# Patient Record
Sex: Female | Born: 1980 | Race: White | Hispanic: No | Marital: Married | Smoking: Never smoker
Health system: Southern US, Community
[De-identification: ages and names within clinical notes are randomized; demographics above are authoritative.]

## PROBLEM LIST (undated history)

## (undated) DIAGNOSIS — M1990 Unspecified osteoarthritis, unspecified site: Secondary | ICD-10-CM

## (undated) DIAGNOSIS — E039 Hypothyroidism, unspecified: Secondary | ICD-10-CM

## (undated) DIAGNOSIS — C4491 Basal cell carcinoma of skin, unspecified: Secondary | ICD-10-CM

## (undated) DIAGNOSIS — Z8619 Personal history of other infectious and parasitic diseases: Secondary | ICD-10-CM

## (undated) HISTORY — DX: Basal cell carcinoma of skin, unspecified: C44.91

## (undated) HISTORY — DX: Personal history of other infectious and parasitic diseases: Z86.19

## (undated) HISTORY — DX: Hypothyroidism, unspecified: E03.9

## (undated) HISTORY — DX: Unspecified osteoarthritis, unspecified site: M19.90

---

## 2008-07-30 HISTORY — PX: OTHER SURGICAL HISTORY: SHX169

## 2009-01-27 HISTORY — PX: OTHER SURGICAL HISTORY: SHX169

## 2011-07-09 DIAGNOSIS — Z79899 Other long term (current) drug therapy: Secondary | ICD-10-CM | POA: Insufficient documentation

## 2013-01-05 ENCOUNTER — Other Ambulatory Visit (HOSPITAL_COMMUNITY)
Admission: RE | Admit: 2013-01-05 | Discharge: 2013-01-05 | Disposition: A | Discharge status: Home / Self Care | Payer: BC Managed Care – PPO | Source: Ambulatory Visit | Attending: Family | Admitting: Family

## 2013-01-05 ENCOUNTER — Encounter: Payer: Self-pay | Admitting: Family

## 2013-01-05 ENCOUNTER — Ambulatory Visit (INDEPENDENT_AMBULATORY_CARE_PROVIDER_SITE_OTHER): Payer: BC Managed Care – PPO | Admitting: Family

## 2013-01-05 VITALS — BP 90/60 | HR 86 | Temp 98.8°F | Resp 16 | Ht 66.5 in | Wt 123.1 lb

## 2013-01-05 DIAGNOSIS — Z0000 Encounter for general adult medical examination without abnormal findings: Secondary | ICD-10-CM

## 2013-01-05 DIAGNOSIS — Z01419 Encounter for gynecological examination (general) (routine) without abnormal findings: Secondary | ICD-10-CM | POA: Insufficient documentation

## 2013-01-05 DIAGNOSIS — M069 Rheumatoid arthritis, unspecified: Secondary | ICD-10-CM

## 2013-01-05 DIAGNOSIS — M064 Inflammatory polyarthropathy: Secondary | ICD-10-CM | POA: Insufficient documentation

## 2013-01-05 DIAGNOSIS — Z23 Encounter for immunization: Secondary | ICD-10-CM

## 2013-01-05 NOTE — Assessment & Plan Note (Signed)
Continue healthy diet, exercise.  Pap performed today.  She will return for fasting labs.  Tdap today.

## 2013-01-05 NOTE — Progress Notes (Signed)
Subjective:    Patient ID: Gina Butler, female    DOB: April 25, 1981, 32 y.o.   MRN: [id number]  HPI  [name] is a 32 yr old female who presents today to establish care.  1) Rheumatoid Arthritis- Reports that in 2009 had severe swelling in her right ankle.  She is followed at [facility].  She has been off of methotrexate x 1 year.  She sees Renee Pain NP.    2) Preventative care-  Immunizations: ? Last tetanus prior to college. Diet: reports reasonably healthy diet.   Exercise: does elliptical 30 min 6 times a week.  Pap Smear: 2011- normal- never had abnormal.  Review of Systems  Constitutional: Negative for unexpected weight change.  HENT: Negative for hearing loss and congestion.   Eyes: Negative for visual disturbance.  Respiratory: Negative for cough.   Cardiovascular:       Occasional swelling in the mornings in the feet/ankles  Gastrointestinal: Negative for nausea, constipation and blood in stool.  Genitourinary: Negative for dysuria, frequency and menstrual problem.  Musculoskeletal: Positive for joint swelling.  Skin: Negative for rash.  Neurological: Negative for headaches.  Hematological: Negative for adenopathy.  Psychiatric/Behavioral:       Denies depression/anxiety   Past Medical History  Diagnosis Date  . Arthritis   . History of chicken pox     History   Social History  . Marital Status: Married    Spouse Name: N/A    Number of Children: 0  . Years of Education: N/A   Occupational History  .     Social History Main Topics  . Smoking status: Never Smoker   . Smokeless tobacco: Never Used  . Alcohol Use: 1 - 2 oz/week    2-4 drink(s) per week  . Drug Use: Not on file  . Sexually Active: Not on file   Other Topics Concern  . 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 Other surgical history Right  2010    ankle-- deep tissue biopsy    Family History  Problem Relation Age of Onset  . Arthritis Mother     osteoarthritis  . Cancer Maternal Aunt     breast  . Arthritis Maternal Grandmother   . Stroke Maternal Grandfather   . Schizophrenia Maternal Aunt     Allergies  Allergen Reactions  . Compazine (Prochlorperazine Edisylate) Other (See Comments)    "Affected the muscles in my neck, could not move my head."  . Sulfa Antibiotics     Childhood reaction    No current outpatient prescriptions on file prior to visit.   No current facility-administered medications on file prior to visit.    BP 90/60  Pulse 86  Temp(Src) 98.8 F (37.1 C) (Oral)  Resp 16  Ht 5' 6.5" (1.689 m)  Wt 123 lb 1.9 oz (55.847 kg)  BMI 19.58 kg/m2  SpO2 96%  LMP 01/01/2013        Objective:   Physical Exam  Physical Exam  Constitutional: She is oriented to person, place, and time. She appears well-developed and well-nourished. No distress.  HENT:  Head: Normocephalic and atraumatic.  Right Ear: Tympanic membrane and ear canal normal.  Left Ear: Tympanic membrane and ear canal normal.  Mouth/Throat: Oropharynx is clear and moist.  Eyes: Pupils are equal, round, and  reactive to light. No scleral icterus.  Neck: Normal range of motion. No thyromegaly present.  Cardiovascular: Normal rate and regular rhythm.   No murmur heard. Pulmonary/Chest: Effort normal and breath sounds normal. No respiratory distress. He has no wheezes. She has no rales. She exhibits no tenderness.  Abdominal: Soft. Bowel sounds are normal. He exhibits no distension and no mass. There is no tenderness. There is no rebound and no guarding.  Musculoskeletal: She exhibits no edema.  Lymphadenopathy:    She has no cervical adenopathy.  Neurological: She is alert and oriented to person, place, and time. She has normal patellar reflexes. She exhibits normal muscle tone. Coordination normal.  Skin: Skin is warm and dry.   Psychiatric: She has a normal mood and affect. Her behavior is normal. Judgment and thought content normal.  Breasts: Examined lying Right: Without masses, retractions, discharge or axillary adenopathy.  Left: Without masses, retractions, discharge or axillary adenopathy.  Inguinal/mons: Normal without inguinal adenopathy  External genitalia: Normal  BUS/Urethra/Skene's glands: Normal  Bladder: Normal  Vagina: Normal  Cervix: Normal  Uterus: normal in size, shape and contour. Midline and mobile  Adnexa/parametria:  Rt: Without masses or tenderness.  Lt: Without masses or tenderness.  Anus and perineum: Normal           Assessment & Plan:        Assessment & Plan:

## 2013-01-05 NOTE — Patient Instructions (Addendum)
Please return fasting to the lab one day this week. We will contact you about your lab work. Follow up in 1 year, sooner if problems/concerns.  Welcome to [facility]!

## 2013-01-05 NOTE — Assessment & Plan Note (Signed)
Stable. Management per rheumatology.  

## 2013-01-07 ENCOUNTER — Encounter: Payer: Self-pay | Admitting: Family

## 2013-01-08 ENCOUNTER — Encounter: Payer: Self-pay | Admitting: Family

## 2013-01-08 LAB — BASIC METABOLIC PANEL WITH GFR
GFR, Est African American: >89 mL/min
Glucose, Bld: 86 mg/dL (ref 70–99)
Potassium: 4.6 mEq/L (ref 3.5–5.3)
Sodium: 139 mEq/L (ref 135–145)

## 2013-01-08 LAB — LIPID PANEL
HDL: 58 mg/dL (ref >39)
Total CHOL/HDL Ratio: 2.4 Ratio
VLDL: 11 mg/dL (ref 0–40)

## 2013-01-08 LAB — CBC WITH DIFFERENTIAL/PLATELET
Hemoglobin: 13.4 g/dL (ref 12.0–15.0)
Lymphocytes Relative: 35 % (ref 12–46)
Lymphs Abs: 2.1 10*3/uL (ref 0.7–4.0)
MCH: 29.1 pg (ref 26.0–34.0)
Monocytes Relative: 11 % (ref 3–12)
Neutro Abs: 3.1 10*3/uL (ref 1.7–7.7)
Neutrophils Relative %: 53 % (ref 43–77)
Platelets: 212 10*3/uL (ref 150–400)
RBC: 4.61 MIL/uL (ref 3.87–5.11)
WBC: 5.8 10*3/uL (ref 4.0–10.5)

## 2013-01-08 LAB — HEPATIC FUNCTION PANEL
AST: 17 U/L (ref 0–37)
Alkaline Phosphatase: 51 U/L (ref 39–117)
Bilirubin, Direct: 0.3 mg/dL (ref 0.0–0.3)
Indirect Bilirubin: 0.4 mg/dL (ref 0.0–0.9)
Total Bilirubin: 0.7 mg/dL (ref 0.3–1.2)

## 2013-01-08 LAB — TSH: TSH: 2.294 u[IU]/mL (ref 0.350–4.500)

## 2013-01-08 LAB — URINALYSIS, ROUTINE W REFLEX MICROSCOPIC
Hgb urine dipstick: NEGATIVE
Ketones, ur: NEGATIVE mg/dL
Leukocytes, UA: NEGATIVE
Nitrite: NEGATIVE
Protein, ur: NEGATIVE mg/dL
pH: 6.5 (ref 5.0–8.0)

## 2013-01-11 ENCOUNTER — Encounter: Payer: Self-pay | Admitting: Family

## 2013-06-04 ENCOUNTER — Other Ambulatory Visit: Payer: Self-pay

## 2013-11-24 ENCOUNTER — Encounter: Payer: Self-pay | Admitting: Family

## 2013-11-26 ENCOUNTER — Other Ambulatory Visit: Payer: Self-pay | Admitting: Family

## 2013-11-26 MED ORDER — LEVONORGESTREL-ETHINYL ESTRAD 0.1-20 MG-MCG PO TABS: 1.0000 | ORAL_TABLET | Freq: Every day | ORAL | Status: DC

## 2014-01-19 ENCOUNTER — Encounter: Payer: BC Managed Care – PPO | Admitting: Family

## 2014-01-26 ENCOUNTER — Ambulatory Visit (INDEPENDENT_AMBULATORY_CARE_PROVIDER_SITE_OTHER): Payer: BC Managed Care – PPO | Admitting: Family

## 2014-01-26 ENCOUNTER — Encounter: Payer: Self-pay | Admitting: Family

## 2014-01-26 VITALS — BP 100/60 | HR 78 | Temp 98.0°F | Resp 16 | Ht 66.5 in | Wt 118.0 lb

## 2014-01-26 DIAGNOSIS — R634 Abnormal weight loss: Secondary | ICD-10-CM

## 2014-01-26 DIAGNOSIS — Z0000 Encounter for general adult medical examination without abnormal findings: Secondary | ICD-10-CM

## 2014-01-26 DIAGNOSIS — M069 Rheumatoid arthritis, unspecified: Secondary | ICD-10-CM

## 2014-01-26 LAB — TSH: TSH: 3.011 u[IU]/mL (ref 0.350–4.500)

## 2014-01-26 MED ORDER — LEVONORGESTREL-ETHINYL ESTRAD 0.1-20 MG-MCG PO TABS: 1.0000 | ORAL_TABLET | Freq: Every day | ORAL | Status: DC

## 2014-01-26 NOTE — Assessment & Plan Note (Signed)
Continue healthy diet, exercise. Advised pt to make sure that she is eating enough calories when exercising to avoid further weight loss.   Immunizations up to date.

## 2014-01-26 NOTE — Progress Notes (Signed)
Pre visit review using our clinic review tool, if applicable. No additional management support is needed unless otherwise documented below in the visit note. 

## 2014-01-26 NOTE — Assessment & Plan Note (Signed)
Clinically stable. Management per [facility] rheumatology.

## 2014-01-26 NOTE — Progress Notes (Signed)
Subjective:    Patient ID: Gina Butler, female    DOB: 03/28/1981, 33 y.o.   MRN: [id number]  HPI  Patient presents today for complete physical.  Immunizations: up to date Diet: reports healthy diet Exercise: 2x a week- elliptical Pap Smear: negative 6/14  RA- followed at [facility]. She is of plaquenil. Was last seen in June, but was told vitamin D level low and was placed on vitamin D  Review of Systems  Constitutional:       Wt Readings from Last 3 Encounters: 01/26/14 : 118 lb (53.524 kg) 01/05/13 : 123 lb 1.9 oz (55.847 kg)   HENT: Negative for hearing loss and rhinorrhea.   Eyes: Negative for visual disturbance.  Respiratory: Negative for cough and shortness of breath.   Cardiovascular: Negative for chest pain.  Gastrointestinal: Negative for vomiting, abdominal pain, diarrhea, constipation and blood in stool.  Genitourinary: Negative for dysuria, frequency and menstrual problem.  Musculoskeletal: Negative for arthralgias and myalgias.  Skin: Negative for rash.  Neurological: Negative for headaches.  Hematological: Negative for adenopathy.  Psychiatric/Behavioral:       Denies depression/anxiety       Past Medical History  Diagnosis Date  . Arthritis   . History of chicken pox     History   Social History  . Marital Status: Married    Spouse Name: N/A    Number of Children: 0  . Years of Education: N/A   Occupational History  .     Social History Main Topics  . Smoking status: Never Smoker   . Smokeless tobacco: Never Used  . Alcohol Use: 1.0 - 2.0 oz/week    2-4 drink(s) per week  . Drug Use: Not on file  . Sexual Activity: Not on file   Other Topics Concern  . 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 Other surgical history Right 2010    ankle-- deep tissue biopsy    Family History  Problem  Relation Age of Onset  . Arthritis Mother     osteoarthritis  . Cancer Maternal Aunt     breast  . Arthritis Maternal Grandmother   . Stroke Maternal Grandfather   . Schizophrenia Maternal Aunt     Allergies  Allergen Reactions  . Compazine [Prochlorperazine Edisylate] Other (See Comments)    "Affected the muscles in my neck, could not move my head."  . Sulfa Antibiotics     Childhood reaction    No current outpatient prescriptions on file prior to visit.   No current facility-administered medications on file prior to visit.    BP 100/60  Pulse 78  Temp(Src) 98 F (36.7 C) (Oral)  Resp 16  Ht 5' 6.5" (1.689 m)  Wt 118 lb (53.524 kg)  BMI 18.76 kg/m2  SpO2 97%  LMP 01/17/2014    Objective:   Physical Exam  Physical Exam  Constitutional: She is oriented to person, place, and time. She appears well-developed and well-nourished. No distress.  HENT:  Head: Normocephalic and atraumatic.  Right Ear: Tympanic membrane and ear canal normal.  Left Ear: Tympanic membrane and ear canal normal.  Mouth/Throat: Oropharynx is clear and moist.  Eyes:  No scleral icterus.  Neck: Normal range of motion. No thyromegaly present.  Cardiovascular: Normal rate and regular rhythm.   No murmur  heard. Pulmonary/Chest: Effort normal and breath sounds normal. No respiratory distress. He has no wheezes. She has no rales. She exhibits no tenderness.  Abdominal: Soft. Bowel sounds are normal. He exhibits no distension and no mass. There is no tenderness. There is no rebound and no guarding.  Musculoskeletal: She exhibits no edema.  Lymphadenopathy:    She has no cervical adenopathy.  Neurological: She is alert and oriented to person, place, and time.  She exhibits normal muscle tone. Coordination normal.  Skin: Skin is warm and dry.  Psychiatric: She has a normal mood and affect. Her behavior is normal. Judgment and thought content normal.  Breasts: Examined lying Right: Without masses,  retractions, discharge or axillary adenopathy.  Left: Without masses, retractions, discharge or axillary adenopathy.  Pelvic: deferred        Assessment & Plan:

## 2014-01-26 NOTE — Patient Instructions (Signed)
Please complete lab work prior to leaving. Continue healthy diet and exercise. Follow up in 1 year.

## 2014-01-27 LAB — URINALYSIS, MICROSCOPIC ONLY
Bacteria, UA: NONE SEEN
CRYSTALS: NONE SEEN
Casts: NONE SEEN

## 2014-01-27 LAB — URINALYSIS, ROUTINE W REFLEX MICROSCOPIC
GLUCOSE, UA: NEGATIVE mg/dL
Hgb urine dipstick: NEGATIVE
Ketones, ur: NEGATIVE mg/dL
Leukocytes, UA: NEGATIVE
Nitrite: NEGATIVE
Protein, ur: NEGATIVE mg/dL
Specific Gravity, Urine: 1.026 (ref 1.005–1.030)
Urobilinogen, UA: 0.2 mg/dL (ref 0.0–1.0)
pH: 5 (ref 5.0–8.0)

## 2014-01-28 ENCOUNTER — Encounter: Payer: Self-pay | Admitting: Family

## 2014-01-31 ENCOUNTER — Encounter: Payer: Self-pay | Admitting: Family

## 2015-01-19 ENCOUNTER — Other Ambulatory Visit: Payer: Self-pay | Admitting: Family

## 2015-02-01 ENCOUNTER — Telehealth: Payer: Self-pay | Admitting: Family

## 2015-02-01 NOTE — Telephone Encounter (Signed)
pre visit letter mailed 02/01/15 °

## 2015-02-22 ENCOUNTER — Other Ambulatory Visit (HOSPITAL_COMMUNITY)
Admission: RE | Admit: 2015-02-22 | Discharge: 2015-02-22 | Disposition: A | Discharge status: Home / Self Care | Payer: BLUE CROSS/BLUE SHIELD | Source: Ambulatory Visit | Attending: Family | Admitting: Family

## 2015-02-22 ENCOUNTER — Encounter: Payer: Self-pay | Admitting: Family

## 2015-02-22 ENCOUNTER — Ambulatory Visit (INDEPENDENT_AMBULATORY_CARE_PROVIDER_SITE_OTHER): Payer: BLUE CROSS/BLUE SHIELD | Admitting: Family

## 2015-02-22 VITALS — BP 90/70 | HR 73 | Temp 98.9°F | Resp 16 | Ht 66.0 in | Wt 118.8 lb

## 2015-02-22 DIAGNOSIS — Z0000 Encounter for general adult medical examination without abnormal findings: Secondary | ICD-10-CM | POA: Diagnosis not present

## 2015-02-22 DIAGNOSIS — Z1151 Encounter for screening for human papillomavirus (HPV): Secondary | ICD-10-CM | POA: Insufficient documentation

## 2015-02-22 DIAGNOSIS — Z01419 Encounter for gynecological examination (general) (routine) without abnormal findings: Secondary | ICD-10-CM | POA: Diagnosis present

## 2015-02-22 LAB — CBC WITH DIFFERENTIAL/PLATELET
BASOS PCT: 0.3 % (ref 0.0–3.0)
Basophils Absolute: 0 10*3/uL (ref 0.0–0.1)
Eosinophils Absolute: 0 10*3/uL (ref 0.0–0.7)
Eosinophils Relative: 0.6 % (ref 0.0–5.0)
HCT: 42.5 % (ref 36.0–46.0)
Hemoglobin: 14.3 g/dL (ref 12.0–15.0)
LYMPHS PCT: 32.5 % (ref 12.0–46.0)
Lymphs Abs: 2.2 10*3/uL (ref 0.7–4.0)
MCHC: 33.7 g/dL (ref 30.0–36.0)
MCV: 86.7 fl (ref 78.0–100.0)
MONO ABS: 0.5 10*3/uL (ref 0.1–1.0)
Monocytes Relative: 7.2 % (ref 3.0–12.0)
NEUTROS ABS: 4 10*3/uL (ref 1.4–7.7)
Neutrophils Relative %: 59.4 % (ref 43.0–77.0)
Platelets: 247 10*3/uL (ref 150.0–400.0)
RBC: 4.91 Mil/uL (ref 3.87–5.11)
RDW: 13.1 % (ref 11.5–15.5)
WBC: 6.7 10*3/uL (ref 4.0–10.5)

## 2015-02-22 LAB — LIPID PANEL
CHOL/HDL RATIO: 3
CHOLESTEROL: 157 mg/dL (ref 0–200)
HDL: 58.1 mg/dL (ref >39.00)
LDL CALC: 82 mg/dL (ref 0–99)
NONHDL: 98.9
TRIGLYCERIDES: 86 mg/dL (ref 0.0–149.0)
VLDL: 17.2 mg/dL (ref 0.0–40.0)

## 2015-02-22 LAB — BASIC METABOLIC PANEL
BUN: 10 mg/dL (ref 6–23)
CHLORIDE: 103 meq/L (ref 96–112)
CO2: 28 mEq/L (ref 19–32)
CREATININE: 0.85 mg/dL (ref 0.40–1.20)
Calcium: 9.2 mg/dL (ref 8.4–10.5)
GFR: 82.38 mL/min (ref >60.00)
GLUCOSE: 85 mg/dL (ref 70–99)
Potassium: 3.8 mEq/L (ref 3.5–5.1)
Sodium: 138 mEq/L (ref 135–145)

## 2015-02-22 LAB — URINALYSIS, ROUTINE W REFLEX MICROSCOPIC
Bilirubin Urine: NEGATIVE
HGB URINE DIPSTICK: NEGATIVE
KETONES UR: NEGATIVE
Leukocytes, UA: NEGATIVE
Nitrite: NEGATIVE
RBC / HPF: NONE SEEN (ref 0–?)
SPECIFIC GRAVITY, URINE: 1.015 (ref 1.000–1.030)
Total Protein, Urine: NEGATIVE
Urine Glucose: NEGATIVE
Urobilinogen, UA: 0.2 (ref 0.0–1.0)
pH: 6.5 (ref 5.0–8.0)

## 2015-02-22 LAB — HEPATIC FUNCTION PANEL
ALK PHOS: 59 U/L (ref 39–117)
ALT: 16 U/L (ref 0–35)
AST: 19 U/L (ref 0–37)
Albumin: 4.3 g/dL (ref 3.5–5.2)
BILIRUBIN TOTAL: 0.9 mg/dL (ref 0.2–1.2)
Bilirubin, Direct: 0.2 mg/dL (ref 0.0–0.3)
TOTAL PROTEIN: 7.6 g/dL (ref 6.0–8.3)

## 2015-02-22 LAB — TSH: TSH: 2.25 u[IU]/mL (ref 0.35–4.50)

## 2015-02-22 MED ORDER — LEVONORGESTREL-ETHINYL ESTRAD 0.1-20 MG-MCG PO TABS: 1.0000 | ORAL_TABLET | Freq: Every day | ORAL | Status: DC

## 2015-02-22 NOTE — Progress Notes (Signed)
Pre visit review using our clinic review tool, if applicable. No additional management support is needed unless otherwise documented below in the visit note. 

## 2015-02-22 NOTE — Patient Instructions (Signed)
Please complete lab work prior to leaving. Continue healthy diet and regular exercise. Follow up in 1 year, sooner if problems/concerns.

## 2015-02-22 NOTE — Assessment & Plan Note (Signed)
Continue healthy diet, exercise, Pap performed today. Obtain routine lab work.

## 2015-02-22 NOTE — Progress Notes (Signed)
Subjective:    Patient ID: Gina Butler, female    DOB: 01-08-1981, 34 y.o.   MRN: [id number]  HPI  Patient presents today for complete physical.  Immunizations: up to date. Diet: healthy Exercise: moderate- uses elliptical, walks the dog.  Wt Readings from Last 3 Encounters:  02/22/15 118 lb 12.8 oz (53.887 kg)  01/26/14 118 lb (53.524 kg)  01/05/13 123 lb 1.9 oz (55.847 kg)  Pap Smear: 2014 Dental:  Due Eye exam 6/16- normal per pt  Review of Systems  Constitutional: Negative for unexpected weight change.  HENT: Negative for hearing loss and rhinorrhea.   Eyes: Negative for visual disturbance.  Respiratory: Negative for cough.   Cardiovascular: Negative for leg swelling.  Gastrointestinal: Negative for nausea, diarrhea and constipation.  Genitourinary: Negative for dysuria, frequency and menstrual problem.  Musculoskeletal: Negative for myalgias.       Saw Rheum in jan due to ankle swelling, now resolved.   Skin: Negative for rash.  Neurological: Negative for headaches.  Hematological: Negative for adenopathy.  Psychiatric/Behavioral: Negative for dysphoric mood and agitation.   Past Medical History  Diagnosis Date  . Arthritis   . History of chicken pox     History   Social History  . Marital Status: Married    Spouse Name: N/A  . Number of Children: 0  . Years of Education: N/A   Occupational History  .     Social History Main Topics  . Smoking status: Never Smoker   . Smokeless tobacco: Never Used  . Alcohol Use: 1.0 - 2.0 oz/week    2-4 Standard drinks or equivalent per week  . Drug Use: No  . Sexual Activity: Not on file   Other Topics Concern  . 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 Other surgical history Right 2010    ankle-- deep tissue biopsy  . Deep tissue biopsy Right 01/27/09    ankle.   "arthritis"    Family History  Problem Relation Age of Onset  . Arthritis Mother     osteoarthritis  . Cancer Maternal Aunt     breast  . Arthritis Maternal Grandmother   . Stroke Maternal Grandfather   . Cancer Maternal Grandfather 94    colon  . Schizophrenia Maternal Aunt     Allergies  Allergen Reactions  . Compazine [Prochlorperazine Edisylate] Other (See Comments)    "Affected the muscles in my neck, could not move my head."  . Sulfa Antibiotics     Childhood reaction    Current Outpatient Prescriptions on File Prior to Visit  Medication Sig Dispense Refill  . LUTERA 0.1-20 MG-MCG tablet TAKE 1 TABLET BY MOUTH DAILY 84 tablet 0   No current facility-administered medications on file prior to visit.    BP 90/70 mmHg  Pulse 73  Temp(Src) 98.9 F (37.2 C) (Oral)  Resp 16  Ht 5\' 6"  (1.676 m)  Wt 118 lb 12.8 oz (53.887 kg)  BMI 19.18 kg/m2  SpO2 99%  LMP 02/16/2015        Objective:   Physical Exam  Physical Exam  Constitutional: She is oriented to person, place, and time. She appears well-developed and well-nourished. No distress.  HENT:  Head: Normocephalic and atraumatic.  Right Ear: Tympanic membrane and ear canal normal.  Left Ear: Tympanic  membrane and ear canal normal.  Mouth/Throat: Oropharynx is clear and moist.  Eyes: Pupils are equal, round, and reactive to light. No scleral icterus.  Neck: Normal range of motion. No thyromegaly present.  Cardiovascular: Normal rate and regular rhythm.   No murmur heard. Pulmonary/Chest: Effort normal and breath sounds normal. No respiratory distress. He has no wheezes. She has no rales. She exhibits no tenderness.  Abdominal: Soft. Bowel sounds are normal. He exhibits no distension and no mass. There is no tenderness. There is no rebound and no guarding.  Musculoskeletal: She exhibits no edema.  Lymphadenopathy:    She has no cervical adenopathy.  Neurological: She is alert and oriented to person, place,  and time. She has normal patellar reflexes. She exhibits normal muscle tone. Coordination normal.  Skin: Skin is warm and dry. some bruising noted on legs (from "wakeboarding") Psychiatric: She has a normal mood and affect. Her behavior is normal. Judgment and thought content normal.  Breasts: Examined lying Right: Without masses, retractions, discharge or axillary adenopathy.  Left: Without masses, retractions, discharge or axillary adenopathy.  Inguinal/mons: Normal without inguinal adenopathy  External genitalia: Normal  BUS/Urethra/Skene's glands: Normal  Bladder: Normal  Vagina: Normal  Cervix: Normal  Uterus: normal in size, shape and contour. Midline and mobile  Adnexa/parametria:  Rt: Without masses or tenderness.  Lt: Without masses or tenderness.  Anus and perineum: Normal           Assessment & Plan:        Assessment & Plan:  Pt advised to schedule routine dental care.

## 2015-02-23 ENCOUNTER — Encounter: Payer: Self-pay | Admitting: Family

## 2015-02-23 LAB — CYTOLOGY - PAP

## 2015-12-19 ENCOUNTER — Other Ambulatory Visit: Payer: Self-pay | Admitting: Family

## 2015-12-27 ENCOUNTER — Other Ambulatory Visit: Payer: Self-pay | Admitting: Family

## 2016-02-07 ENCOUNTER — Encounter: Payer: Self-pay | Admitting: Family

## 2016-02-14 ENCOUNTER — Encounter: Payer: Self-pay | Admitting: Family

## 2016-02-14 MED ORDER — LEVONORGESTREL-ETHINYL ESTRAD 0.1-20 MG-MCG PO TABS: 1.0000 | ORAL_TABLET | Freq: Every day | ORAL | Status: DC

## 2016-02-28 ENCOUNTER — Encounter: Payer: Self-pay | Admitting: Family

## 2016-02-28 ENCOUNTER — Ambulatory Visit (INDEPENDENT_AMBULATORY_CARE_PROVIDER_SITE_OTHER): Payer: BLUE CROSS/BLUE SHIELD | Admitting: Family

## 2016-02-28 VITALS — BP 110/76 | HR 77 | Temp 97.9°F | Resp 16 | Ht 66.6 in | Wt 125.6 lb

## 2016-02-28 DIAGNOSIS — Z0000 Encounter for general adult medical examination without abnormal findings: Secondary | ICD-10-CM

## 2016-02-28 LAB — URINALYSIS, ROUTINE W REFLEX MICROSCOPIC
Ketones, ur: NEGATIVE
Leukocytes, UA: NEGATIVE
Nitrite: NEGATIVE
SPECIFIC GRAVITY, URINE: 1.025 (ref 1.000–1.030)
Urine Glucose: NEGATIVE
Urobilinogen, UA: 1 (ref 0.0–1.0)
pH: 6 (ref 5.0–8.0)

## 2016-02-28 LAB — CBC WITH DIFFERENTIAL/PLATELET
BASOS ABS: 0 10*3/uL (ref 0.0–0.1)
Basophils Relative: 0.2 % (ref 0.0–3.0)
EOS ABS: 0 10*3/uL (ref 0.0–0.7)
Eosinophils Relative: 0.2 % (ref 0.0–5.0)
HEMATOCRIT: 39.8 % (ref 36.0–46.0)
Hemoglobin: 13.5 g/dL (ref 12.0–15.0)
LYMPHS PCT: 18.6 % (ref 12.0–46.0)
Lymphs Abs: 1.7 10*3/uL (ref 0.7–4.0)
MCHC: 33.9 g/dL (ref 30.0–36.0)
MCV: 84.3 fl (ref 78.0–100.0)
Monocytes Absolute: 0.9 10*3/uL (ref 0.1–1.0)
Monocytes Relative: 9.9 % (ref 3.0–12.0)
NEUTROS ABS: 6.3 10*3/uL (ref 1.4–7.7)
NEUTROS PCT: 71.1 % (ref 43.0–77.0)
PLATELETS: 267 10*3/uL (ref 150.0–400.0)
RBC: 4.73 Mil/uL (ref 3.87–5.11)
RDW: 12.8 % (ref 11.5–15.5)
WBC: 8.9 10*3/uL (ref 4.0–10.5)

## 2016-02-28 LAB — HEPATIC FUNCTION PANEL
ALK PHOS: 54 U/L (ref 39–117)
ALT: 13 U/L (ref 0–35)
AST: 15 U/L (ref 0–37)
Albumin: 4.2 g/dL (ref 3.5–5.2)
BILIRUBIN DIRECT: 0.1 mg/dL (ref 0.0–0.3)
TOTAL PROTEIN: 7.7 g/dL (ref 6.0–8.3)
Total Bilirubin: 0.5 mg/dL (ref 0.2–1.2)

## 2016-02-28 LAB — LIPID PANEL
CHOL/HDL RATIO: 2
Cholesterol: 165 mg/dL (ref 0–200)
HDL: 67.6 mg/dL (ref >39.00)
LDL CALC: 85 mg/dL (ref 0–99)
NONHDL: 97.15
Triglycerides: 60 mg/dL (ref 0.0–149.0)
VLDL: 12 mg/dL (ref 0.0–40.0)

## 2016-02-28 LAB — BASIC METABOLIC PANEL
BUN: 10 mg/dL (ref 6–23)
CALCIUM: 9.4 mg/dL (ref 8.4–10.5)
CO2: 28 meq/L (ref 19–32)
CREATININE: 0.81 mg/dL (ref 0.40–1.20)
Chloride: 104 mEq/L (ref 96–112)
GFR: 86.55 mL/min (ref >60.00)
Glucose, Bld: 98 mg/dL (ref 70–99)
Potassium: 4.3 mEq/L (ref 3.5–5.1)
Sodium: 139 mEq/L (ref 135–145)

## 2016-02-28 LAB — TSH: TSH: 2.65 u[IU]/mL (ref 0.35–4.50)

## 2016-02-28 NOTE — Assessment & Plan Note (Signed)
Encouraged pt to continue healthy diet, exercise. Immunizations/pap up to date. She will schedule vision/dental. Obtain routine lab work.

## 2016-02-28 NOTE — Patient Instructions (Signed)
Continue healthy diet and exercise.

## 2016-02-28 NOTE — Progress Notes (Signed)
Pre visit review using our clinic review tool, if applicable. No additional management support is needed unless otherwise documented below in the visit note. 

## 2016-02-28 NOTE — Progress Notes (Signed)
Subjective:    Patient ID: Gina Butler, female    DOB: 11-Jan-1981, 35 y.o.   MRN: [id number]  HPI  Patient presents today for complete physical.  Immunizations: tdap 2014 Diet:healthy Exercise: kayaking/canoeing, elliptical Pap Smear:  7/16 Dental: due Vision: last summer Wt Readings from Last 3 Encounters:  02/28/16 125 lb 9.6 oz (57 kg)  02/22/15 118 lb 12.8 oz (53.9 kg)  01/26/14 118 lb (53.5 kg)    Review of Systems  Constitutional: Negative for unexpected weight change.  HENT: Negative for hearing loss and rhinorrhea.   Eyes: Negative for visual disturbance.  Respiratory: Negative for cough.   Cardiovascular: Negative for leg swelling.  Gastrointestinal: Negative for constipation and diarrhea.  Genitourinary: Negative for dysuria, frequency and menstrual problem.  Musculoskeletal: Positive for arthralgias. Negative for myalgias.       RA flared in June (has follow up with Rheum tomorrow).   Skin: Negative for rash.  Neurological: Negative for headaches.  Hematological: Negative for adenopathy.  Psychiatric/Behavioral:       Denies depression/anxiety   Past Medical History:  Diagnosis Date  . Arthritis   . History of chicken pox      Social History   Social History  . Marital status: Married    Spouse name: N/A  . Number of children: 0  . Years of education: N/A   Occupational History  .  [place] History Main Topics  . Smoking status: Never Smoker  . Smokeless tobacco: Never Used  . Alcohol use 1.0 - 2.0 oz/week    2 - 4 Standard drinks or equivalent per week  . Drug use: No  . Sexual activity: Not on file   Other Topics Concern  . 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 deep tissue biopsy Right 01/27/09   ankle.  "arthritis"  . OTHER SURGICAL HISTORY Right 2010   ankle-- deep tissue biopsy    Family History  Problem Relation Age of Onset  . Arthritis Mother     osteoarthritis  . Cancer Maternal Aunt     breast  . Arthritis Maternal Grandmother   . Stroke Maternal Grandfather   . Cancer Maternal Grandfather 94    colon  . Schizophrenia Maternal Aunt     Allergies  Allergen Reactions  . Compazine [Prochlorperazine Edisylate] Other (See Comments)    "Affected the muscles in my neck, could not move my head."  . Sulfa Antibiotics     Childhood reaction    Current Outpatient Prescriptions on File Prior to Visit  Medication Sig Dispense Refill  . levonorgestrel-ethinyl estradiol (LUTERA) 0.1-20 MG-MCG tablet Take 1 tablet by mouth daily. 28 tablet 0   No current facility-administered medications on file prior to visit.     BP 110/76   Pulse 77   Temp 97.9 F (36.6 C) (Oral)   Resp 16   Ht 5' 6.6" (1.692 m)   Wt 125 lb 9.6 oz (57 kg)   LMP 02/13/2016   SpO2 98% Comment: room air  BMI 19.91 kg/m       Objective:   Physical Exam Physical Exam  Constitutional: She is oriented to person, place, and time. She appears well-developed and well-nourished. No distress.  HENT:  Head: Normocephalic and atraumatic.  Right Ear:  Tympanic membrane and ear canal normal.  Left Ear: Tympanic membrane and ear canal normal.  Mouth/Throat: Oropharynx is clear and moist.  Eyes: Pupils are equal, round, and reactive to light. No scleral icterus.  Neck: Normal range of motion. No thyromegaly present.  Cardiovascular: Normal rate and regular rhythm.   No murmur heard. Pulmonary/Chest: Effort normal and breath sounds normal. No respiratory distress. He has no wheezes. She has no rales. She exhibits no tenderness.  Abdominal: Soft. Bowel sounds are normal. She exhibits no distension and no mass. There is no tenderness. There is no rebound and no guarding.  Musculoskeletal: She exhibits no edema.  Lymphadenopathy:    She has no cervical adenopathy.    Neurological: She is alert and oriented to person, place, and time. She has normal patellar reflexes. She exhibits normal muscle tone. Coordination normal.  Skin: Skin is warm and dry.  Psychiatric: She has a normal mood and affect. Her behavior is normal. Judgment and thought content normal.  Breasts: Examined lying Right: Without masses, retractions, discharge or axillary adenopathy.  Left: Without masses, retractions, discharge or axillary adenopathy.  Pelvic: deferred          Assessment & Plan:       Assessment & Plan:

## 2016-03-05 ENCOUNTER — Encounter: Payer: Self-pay | Admitting: Family

## 2016-04-04 ENCOUNTER — Other Ambulatory Visit: Payer: Self-pay | Admitting: Family

## 2016-05-01 ENCOUNTER — Other Ambulatory Visit: Payer: Self-pay | Admitting: Family

## 2016-05-02 MED ORDER — LEVONORGESTREL-ETHINYL ESTRAD 0.1-20 MG-MCG PO TABS
1.0000 | ORAL_TABLET | Freq: Every day | ORAL | 1 refills | Status: DC
Start: 2016-05-02 — End: 2017-01-18

## 2017-01-18 ENCOUNTER — Other Ambulatory Visit: Payer: Self-pay | Admitting: Family

## 2017-03-01 ENCOUNTER — Encounter: Payer: BLUE CROSS/BLUE SHIELD | Admitting: Family

## 2017-03-08 ENCOUNTER — Encounter: Payer: Self-pay | Admitting: Family

## 2017-03-08 ENCOUNTER — Ambulatory Visit (INDEPENDENT_AMBULATORY_CARE_PROVIDER_SITE_OTHER): Payer: BLUE CROSS/BLUE SHIELD | Admitting: Family

## 2017-03-08 VITALS — BP 106/70 | HR 76 | Temp 98.9°F | Resp 16 | Ht 66.5 in | Wt 125.6 lb

## 2017-03-08 DIAGNOSIS — Z0000 Encounter for general adult medical examination without abnormal findings: Secondary | ICD-10-CM

## 2017-03-08 LAB — CBC WITH DIFFERENTIAL/PLATELET
Basophils Absolute: 0 K/uL (ref 0.0–0.1)
Basophils Relative: 0.3 % (ref 0.0–3.0)
Eosinophils Absolute: 0 K/uL (ref 0.0–0.7)
Eosinophils Relative: 0.9 % (ref 0.0–5.0)
HCT: 40.9 % (ref 36.0–46.0)
Hemoglobin: 13.5 g/dL (ref 12.0–15.0)
Lymphocytes Relative: 19 % (ref 12.0–46.0)
Lymphs Abs: 1.1 K/uL (ref 0.7–4.0)
MCHC: 33.1 g/dL (ref 30.0–36.0)
MCV: 89.3 fl (ref 78.0–100.0)
Monocytes Absolute: 0.5 K/uL (ref 0.1–1.0)
Monocytes Relative: 9.6 % (ref 3.0–12.0)
Neutro Abs: 3.9 K/uL (ref 1.4–7.7)
Neutrophils Relative %: 70.2 % (ref 43.0–77.0)
Platelets: 237 K/uL (ref 150.0–400.0)
RBC: 4.57 Mil/uL (ref 3.87–5.11)
RDW: 12.7 % (ref 11.5–15.5)
WBC: 5.6 K/uL (ref 4.0–10.5)

## 2017-03-08 LAB — URINALYSIS, ROUTINE W REFLEX MICROSCOPIC
Bilirubin Urine: NEGATIVE
HGB URINE DIPSTICK: NEGATIVE
Ketones, ur: NEGATIVE
Leukocytes, UA: NEGATIVE
Nitrite: NEGATIVE
PH: 6 (ref 5.0–8.0)
Specific Gravity, Urine: 1.02 (ref 1.000–1.030)
TOTAL PROTEIN, URINE-UPE24: NEGATIVE
Urine Glucose: NEGATIVE
Urobilinogen, UA: 0.2 (ref 0.0–1.0)

## 2017-03-08 LAB — LIPID PANEL
Cholesterol: 158 mg/dL (ref 0–200)
HDL: 57.8 mg/dL (ref >39.00)
LDL CALC: 87 mg/dL (ref 0–99)
NONHDL: 100.22
Total CHOL/HDL Ratio: 3
Triglycerides: 66 mg/dL (ref 0.0–149.0)
VLDL: 13.2 mg/dL (ref 0.0–40.0)

## 2017-03-08 LAB — HEPATIC FUNCTION PANEL
ALK PHOS: 46 U/L (ref 39–117)
ALT: 11 U/L (ref 0–35)
AST: 14 U/L (ref 0–37)
Albumin: 4.3 g/dL (ref 3.5–5.2)
BILIRUBIN DIRECT: 0.1 mg/dL (ref 0.0–0.3)
BILIRUBIN TOTAL: 0.6 mg/dL (ref 0.2–1.2)
Total Protein: 7.3 g/dL (ref 6.0–8.3)

## 2017-03-08 LAB — BASIC METABOLIC PANEL
BUN: 10 mg/dL (ref 6–23)
CALCIUM: 8.9 mg/dL (ref 8.4–10.5)
CHLORIDE: 104 meq/L (ref 96–112)
CO2: 27 meq/L (ref 19–32)
CREATININE: 0.83 mg/dL (ref 0.40–1.20)
GFR: 83.62 mL/min (ref >60.00)
GLUCOSE: 87 mg/dL (ref 70–99)
Potassium: 3.9 mEq/L (ref 3.5–5.1)
SODIUM: 137 meq/L (ref 135–145)

## 2017-03-08 LAB — TSH: TSH: 1.94 u[IU]/mL (ref 0.35–4.50)

## 2017-03-08 NOTE — Addendum Note (Signed)
Addended by: Debbrah Alar on: 03/08/2017 12:06 PM   Modules accepted: Orders

## 2017-03-08 NOTE — Progress Notes (Signed)
Subjective:    Patient ID: Gina Butler, female    DOB: 1981-03-02, 36 y.o.   MRN: [id number]  HPI   Gina Butler is a 36 yr old female who presents today for cpx.  Immunizations: tdap 01/15/13 Diet: healthy Exercise: walks every day Pap Smear: 02/22/15- negative  Vision:  Had exam in the spring Dental:  Not up to date    Review of Systems  Constitutional: Negative for unexpected weight change.  HENT: Negative for hearing loss and rhinorrhea.   Eyes: Negative for visual disturbance.  Respiratory: Negative for cough and shortness of breath.   Cardiovascular: Negative for leg swelling.  Gastrointestinal: Negative for constipation and diarrhea.  Genitourinary: Negative for dysuria and frequency.  Musculoskeletal: Negative for arthralgias and myalgias.  Skin: Negative for rash.  Neurological: Negative for headaches.  Hematological: Negative for adenopathy.  Psychiatric/Behavioral:       Denies depression/anxiety       Past Medical History:  Diagnosis Date  . Arthritis   . History of chicken pox      Social History   Social History  . Marital status: Married    Spouse name: N/A  . Number of children: 0  . Years of education: N/A   Occupational History  .  [place] History Main Topics  . Smoking status: Never Smoker  . Smokeless tobacco: Never Used  . Alcohol use 1.0 - 2.0 oz/week    2 - 4 Standard drinks or equivalent per week  . Drug use: No  . Sexual activity: Not on file   Other Topics Concern  . 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 deep tissue biopsy Right 01/27/09   ankle.  "arthritis"  . OTHER SURGICAL HISTORY Right 2010   ankle-- deep tissue biopsy    Family History  Problem Relation Age of Onset  . Arthritis Mother        osteoarthritis  . Cancer Maternal Aunt    breast  . Arthritis Maternal Grandmother   . Stroke Maternal Grandfather   . Cancer Maternal Grandfather 94       colon  . Schizophrenia Maternal Aunt     Allergies  Allergen Reactions  . Compazine [Prochlorperazine Edisylate] Other (See Comments)    "Affected the muscles in my neck, could not move my head."  . Sulfa Antibiotics     Childhood reaction    Current Outpatient Prescriptions on File Prior to Visit  Medication Sig Dispense Refill  . hydroxychloroquine (PLAQUENIL) 200 MG tablet Take 200 mg by mouth every other day.     Glennie Hawk 0.1-20 MG-MCG tablet TAKE 1 TABLET BY MOUTH EVERY DAY 84 tablet 0   No current facility-administered medications on file prior to visit.     BP 106/70 (BP Location: Left Arm, Cuff Size: Normal)   Pulse 76   Temp 98.9 F (37.2 C) (Oral)   Resp 16   Ht 5' 6.5" (1.689 m)   Wt 125 lb 9.6 oz (57 kg)   LMP 02/18/2017   SpO2 100%   BMI 19.97 kg/m     Objective:   Physical Exam   Physical Exam  Constitutional: She is oriented to person, place, and time. She appears well-developed and well-nourished. No distress.  HENT:  Head:  Normocephalic and atraumatic.  Right Ear: Tympanic membrane and ear canal normal.  Left Ear: Tympanic membrane and ear canal normal.  Mouth/Throat: Oropharynx is clear and moist.  Eyes: Pupils are equal, round, and reactive to light. No scleral icterus.  Neck: Normal range of motion. No thyromegaly present.  Cardiovascular: Normal rate and regular rhythm.   No murmur heard. Pulmonary/Chest: Effort normal and breath sounds normal. No respiratory distress. He has no wheezes. She has no rales. She exhibits no tenderness.  Abdominal: Soft. Bowel sounds are normal. She exhibits no distension and no mass. There is no tenderness. There is no rebound and no guarding.  Musculoskeletal: She exhibits no edema.  Lymphadenopathy:    She has no cervical adenopathy.  Neurological: She is alert and oriented to person, place, and  time.  She exhibits normal muscle tone. Coordination normal.  Skin: Skin is warm and dry.  Psychiatric: She has a normal mood and affect. Her behavior is normal. Judgment and thought content normal.  Breasts: Examined lying Right: Without masses, retractions, discharge or axillary adenopathy.  R nipple is inverted Left: Without masses, retractions, discharge or axillary adenopathy.          Assessment & Plan:    Preventative care- immunizations/pap up to date. Obtain routine lab work. Pap up to date. She will schedule dental follow up.       Assessment & Plan:

## 2017-03-08 NOTE — Patient Instructions (Addendum)
Please complete lab work prior to leaving.  Continue healthy diet. Work on goal of 30 minutes of cardio 5 days a week.

## 2017-05-28 ENCOUNTER — Other Ambulatory Visit: Payer: Self-pay | Admitting: Family

## 2017-05-30 MED ORDER — LEVONORGESTREL-ETHINYL ESTRAD 0.1-20 MG-MCG PO TABS: 1.0000 | ORAL_TABLET | Freq: Every day | ORAL | 3 refills | Status: DC

## 2018-04-15 ENCOUNTER — Other Ambulatory Visit: Payer: Self-pay | Admitting: Family

## 2018-05-02 ENCOUNTER — Encounter: Payer: No Typology Code available for payment source | Admitting: Family

## 2018-05-02 DIAGNOSIS — Z0289 Encounter for other administrative examinations: Secondary | ICD-10-CM

## 2018-05-13 ENCOUNTER — Encounter: Payer: Self-pay | Admitting: Family

## 2018-06-02 ENCOUNTER — Other Ambulatory Visit (HOSPITAL_COMMUNITY)
Admission: RE | Admit: 2018-06-02 | Discharge: 2018-06-02 | Disposition: A | Discharge status: Home / Self Care | Payer: No Typology Code available for payment source | Source: Ambulatory Visit | Attending: Family | Admitting: Family

## 2018-06-02 ENCOUNTER — Ambulatory Visit (INDEPENDENT_AMBULATORY_CARE_PROVIDER_SITE_OTHER): Payer: No Typology Code available for payment source | Admitting: Family

## 2018-06-02 ENCOUNTER — Encounter: Payer: Self-pay | Admitting: Family

## 2018-06-02 VITALS — BP 110/71 | HR 70 | Temp 99.0°F | Resp 18 | Ht 66.5 in | Wt 124.0 lb

## 2018-06-02 DIAGNOSIS — Z0001 Encounter for general adult medical examination with abnormal findings: Secondary | ICD-10-CM

## 2018-06-02 DIAGNOSIS — E559 Vitamin D deficiency, unspecified: Secondary | ICD-10-CM

## 2018-06-02 DIAGNOSIS — Z0000 Encounter for general adult medical examination without abnormal findings: Secondary | ICD-10-CM

## 2018-06-02 DIAGNOSIS — Z808 Family history of malignant neoplasm of other organs or systems: Secondary | ICD-10-CM

## 2018-06-02 DIAGNOSIS — Z01419 Encounter for gynecological examination (general) (routine) without abnormal findings: Secondary | ICD-10-CM | POA: Insufficient documentation

## 2018-06-02 DIAGNOSIS — N6459 Other signs and symptoms in breast: Secondary | ICD-10-CM

## 2018-06-02 NOTE — Patient Instructions (Signed)
Please complete lab work prior to leaving.   

## 2018-06-02 NOTE — Progress Notes (Signed)
Subjective:    Patient ID: Gina Butler, female    DOB: 1981/03/24, 37 y.o.   MRN: [id number]  HPI  Patient presents today for complete physical.  Immunizations: tdap 6/14, flu shot up todate Diet: healthy Exercise:  Some, walks her dog, hikes on the weekends Pap Smear: 02/22/15 Vision:  Up to date Dental: up to date     Review of Systems  HENT: Negative for hearing loss and rhinorrhea.   Eyes: Negative for visual disturbance.  Respiratory: Negative for cough.   Cardiovascular: Negative for leg swelling.  Gastrointestinal: Negative for constipation and diarrhea.  Genitourinary: Negative for dysuria, frequency, hematuria and menstrual problem.  Musculoskeletal: Positive for arthralgias (fingers).  Skin: Negative for rash.  Neurological: Negative for headaches.  Hematological: Negative for adenopathy.  Psychiatric/Behavioral:       Denies depression/anxiety   Past Medical History:  Diagnosis Date  . Arthritis   . History of chicken pox      Social History   Socioeconomic History  . Marital status: Married    Spouse name: Not on file  . Number of children: 0  . Years of education: Not on file  . Highest education level: Not on file  Occupational History    Employer: [employer]  Social Needs  . Financial resource strain: Not on file  . Food insecurity:    Worry: Not on file    Inability: Not on file  . Transportation needs:    Medical: Not on file    Non-medical: Not on file  Tobacco Use  . Smoking status: Never Smoker  . Smokeless tobacco: Never Used  Substance and Sexual Activity  . Alcohol use: Yes    Alcohol/week: 2.0 - 4.0 standard drinks    Types: 2 - 4 Standard drinks or equivalent per week  . Drug use: No  . Sexual activity: Not on file  Lifestyle  . Physical activity:    Days per week: Not on file    Minutes per session: Not on file  . Stress: Not on file  Relationships  . Social connections:    Talks on phone: Not on file   Gets together: Not on file    Attends religious service: Not on file    Active member of club or organization: Not on file    Attends meetings of clubs or organizations: Not on file    Relationship status: Not on file  . Intimate partner violence:    Fear of current or ex partner: Not on file    Emotionally abused: Not on file    Physically abused: Not on file    Forced sexual activity: Not on file  Other Topics Concern  . 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 deep tissue biopsy Right 01/27/09   ankle.  "arthritis"  . OTHER SURGICAL HISTORY Right 2010   ankle-- deep tissue biopsy    Family History  Problem Relation Age of Onset  . Arthritis Mother        osteoarthritis  . Cancer Maternal Aunt        breast  . Arthritis Maternal Grandmother   . Stroke Maternal Grandfather   . Cancer Maternal Grandfather 94       colon  . Schizophrenia Maternal Aunt  Allergies  Allergen Reactions  . Compazine [Prochlorperazine Edisylate] Other (See Comments)    "Affected the muscles in my neck, could not move my head."  . Sulfa Antibiotics     Childhood reaction    Current Outpatient Medications on File Prior to Visit  Medication Sig Dispense Refill  . LUTERA 0.1-20 MG-MCG tablet TAKE 1 TABLET BY MOUTH DAILY. 84 tablet 0  . Prenatal Vit-Fe Fumarate-FA (PRENATAL MULTIVITAMIN) TABS tablet Take 1 tablet by mouth daily at 12 noon.     No current facility-administered medications on file prior to visit.     BP 110/71 (BP Location: Right Arm, Cuff Size: Normal)   Pulse 70   Temp 99 F (37.2 C) (Oral)   Resp 18   Ht 5' 6.5" (1.689 m)   Wt 124 lb (56.2 kg)   LMP 05/19/2018   SpO2 100%   BMI 19.71 kg/m       Objective:   Physical Exam  Physical Exam  Constitutional: She is oriented to person, place, and time. She appears  well-developed and well-nourished. No distress.  HENT:  Head: Normocephalic and atraumatic.  Right Ear: Tympanic membrane and ear canal normal.  Left Ear: Tympanic membrane and ear canal normal.  Mouth/Throat: Oropharynx is clear and moist.  Eyes: Pupils are equal, round, and reactive to light. No scleral icterus.  Neck: Normal range of motion. No thyromegaly present.  Cardiovascular: Normal rate and regular rhythm.   No murmur heard. Pulmonary/Chest: Effort normal and breath sounds normal. No respiratory distress. He has no wheezes. She has no rales. She exhibits no tenderness.  Abdominal: Soft. Bowel sounds are normal. She exhibits no distension and no mass. There is no tenderness. There is no rebound and no guarding.  Musculoskeletal: She exhibits no edema.  Lymphadenopathy:    She has no cervical adenopathy.  Neurological: She is alert and oriented to person, place, and time. She has normal patellar reflexes. She exhibits normal muscle tone. Coordination normal.  Skin: Skin is warm and dry.  Psychiatric: She has a normal mood and affect. Her behavior is normal. Judgment and thought content normal.  Breasts: Examined lying Right: Without masses, discharge or axillary adenopathy. Right nipple is inverted Left: Without masses, retractions, discharge or axillary adenopathy.  Inguinal/mons: Normal without inguinal adenopathy  External genitalia: Normal  BUS/Urethra/Skene's glands: Normal  Bladder: Normal  Vagina: Normal  Cervix: Normal  Uterus: normal in size, shape and contour. Midline and mobile  Adnexa/parametria:  Rt: Without masses or tenderness.  Lt: Without masses or tenderness.  Anus and perineum: Normal            Assessment & Plan:   Preventative care- discussed healthy diet, exercise.  She is considering trying to become pregnant.  She has started a prenatal vitamin.  We discussed avoiding alcohol, healthy diet and regular exercise.  She has not yet discontinued  her oral contraceptive.  She will also be referred to dermatology for routine skin testing due to her father's history of skin cancer in her fair complexion. Pap performed today.   Inverted nipple-patient reports that this has not always been present.  She feels like it has not been improved for the last several years only.  No discrete masses are noted, however given change will refer for diagnostic mammogram.      Assessment & Plan:

## 2018-06-03 LAB — HEPATIC FUNCTION PANEL
ALK PHOS: 51 U/L (ref 39–117)
ALT: 15 U/L (ref 0–35)
AST: 17 U/L (ref 0–37)
Albumin: 4.5 g/dL (ref 3.5–5.2)
BILIRUBIN DIRECT: 0.1 mg/dL (ref 0.0–0.3)
TOTAL PROTEIN: 7.4 g/dL (ref 6.0–8.3)
Total Bilirubin: 0.5 mg/dL (ref 0.2–1.2)

## 2018-06-03 LAB — CBC WITH DIFFERENTIAL/PLATELET
BASOS PCT: 1 % (ref 0.0–3.0)
Basophils Absolute: 0.1 10*3/uL (ref 0.0–0.1)
Eosinophils Absolute: 0 10*3/uL (ref 0.0–0.7)
Eosinophils Relative: 0.6 % (ref 0.0–5.0)
HCT: 40.7 % (ref 36.0–46.0)
HEMOGLOBIN: 13.8 g/dL (ref 12.0–15.0)
Lymphocytes Relative: 26.2 % (ref 12.0–46.0)
Lymphs Abs: 2 10*3/uL (ref 0.7–4.0)
MCHC: 33.9 g/dL (ref 30.0–36.0)
MCV: 87.5 fl (ref 78.0–100.0)
Monocytes Absolute: 0.7 10*3/uL (ref 0.1–1.0)
Monocytes Relative: 9.7 % (ref 3.0–12.0)
Neutro Abs: 4.7 10*3/uL (ref 1.4–7.7)
Neutrophils Relative %: 62.5 % (ref 43.0–77.0)
Platelets: 267 10*3/uL (ref 150.0–400.0)
RBC: 4.65 Mil/uL (ref 3.87–5.11)
RDW: 12.5 % (ref 11.5–15.5)
WBC: 7.5 10*3/uL (ref 4.0–10.5)

## 2018-06-03 LAB — BASIC METABOLIC PANEL
BUN: 14 mg/dL (ref 6–23)
CHLORIDE: 101 meq/L (ref 96–112)
CO2: 26 mEq/L (ref 19–32)
Calcium: 9.1 mg/dL (ref 8.4–10.5)
Creatinine, Ser: 1.17 mg/dL (ref 0.40–1.20)
GFR: 55.86 mL/min — ABNORMAL LOW (ref >60.00)
Glucose, Bld: 88 mg/dL (ref 70–99)
POTASSIUM: 3.8 meq/L (ref 3.5–5.1)
Sodium: 137 mEq/L (ref 135–145)

## 2018-06-03 LAB — LIPID PANEL
CHOLESTEROL: 162 mg/dL (ref 0–200)
HDL: 58.4 mg/dL (ref >39.00)
LDL CALC: 89 mg/dL (ref 0–99)
NonHDL: 104.02
TRIGLYCERIDES: 73 mg/dL (ref 0.0–149.0)
Total CHOL/HDL Ratio: 3
VLDL: 14.6 mg/dL (ref 0.0–40.0)

## 2018-06-03 LAB — TSH: TSH: 5.25 u[IU]/mL — ABNORMAL HIGH (ref 0.35–4.50)

## 2018-06-03 LAB — URINALYSIS, ROUTINE W REFLEX MICROSCOPIC
BILIRUBIN URINE: NEGATIVE
HGB URINE DIPSTICK: NEGATIVE
KETONES UR: NEGATIVE
LEUKOCYTES UA: NEGATIVE
NITRITE: NEGATIVE
RBC / HPF: NONE SEEN (ref 0–?)
Specific Gravity, Urine: 1.015 (ref 1.000–1.030)
Total Protein, Urine: NEGATIVE
Urine Glucose: NEGATIVE
Urobilinogen, UA: 0.2 (ref 0.0–1.0)
pH: 6 (ref 5.0–8.0)

## 2018-06-04 LAB — CYTOLOGY - PAP: DIAGNOSIS: NEGATIVE

## 2018-06-05 ENCOUNTER — Other Ambulatory Visit (INDEPENDENT_AMBULATORY_CARE_PROVIDER_SITE_OTHER): Payer: No Typology Code available for payment source

## 2018-06-05 ENCOUNTER — Other Ambulatory Visit: Payer: Self-pay | Admitting: Family

## 2018-06-05 DIAGNOSIS — E039 Hypothyroidism, unspecified: Secondary | ICD-10-CM

## 2018-06-05 DIAGNOSIS — N6459 Other signs and symptoms in breast: Secondary | ICD-10-CM

## 2018-06-05 LAB — T3, FREE: T3 FREE: 3.7 pg/mL (ref 2.3–4.2)

## 2018-06-05 LAB — T4, FREE: FREE T4: 0.78 ng/dL (ref 0.60–1.60)

## 2018-06-06 ENCOUNTER — Encounter: Payer: Self-pay | Admitting: Family

## 2018-06-06 ENCOUNTER — Telehealth: Payer: Self-pay | Admitting: Family

## 2018-06-06 DIAGNOSIS — E038 Other specified hypothyroidism: Secondary | ICD-10-CM

## 2018-06-06 DIAGNOSIS — E039 Hypothyroidism, unspecified: Secondary | ICD-10-CM | POA: Insufficient documentation

## 2018-06-06 HISTORY — DX: Other specified hypothyroidism: E03.8

## 2018-06-06 LAB — VITAMIN D 1,25 DIHYDROXY
VITAMIN D 1, 25 (OH) TOTAL: 51 pg/mL (ref 18–72)
VITAMIN D3 1, 25 (OH): 16 pg/mL
Vitamin D2 1, 25 (OH)2: 35 pg/mL

## 2018-06-06 NOTE — Telephone Encounter (Signed)
Lab work shows borderline low thyroid.  Is she having fatigue?  If so I would consider a low dose thyroid medication. If not, then I would recommend that we repeat TSH, free t3 and free t4 in 6 weeks please, dx subclinical hypothyroidism.

## 2018-06-06 NOTE — Telephone Encounter (Signed)
Pt given lab results per notes of Debbrah Alar, NP on 06/06/18. Pt verbalized understanding. She says she's tired, but, can't really say it's fatigue. She says she works and doesn't sleep enough. She says she sleeps 6 hours, but lately have been waking up in the middle of the night by the dog, so it's situational problems causing no sleep and being tired. She also says she wants to know if there is something that she can be doing to bring the level back to normal. She says will this affect her trying to conceive. She says she will make a decision on the lab work after she receives the answers to her questions.

## 2018-06-06 NOTE — Telephone Encounter (Signed)
Attempted to reach pt and left detailed message to return my call. [place] for [facility] / triage to discuss with pt.

## 2018-06-10 NOTE — Addendum Note (Signed)
Addended by: Debbrah Alar on: 06/10/2018 09:37 AM   Modules accepted: Orders

## 2018-06-10 NOTE — Telephone Encounter (Signed)
Left message on cell for pt to check mychart.

## 2018-06-16 ENCOUNTER — Other Ambulatory Visit: Payer: Self-pay | Admitting: Family

## 2018-06-16 ENCOUNTER — Ambulatory Visit
Admission: RE | Admit: 2018-06-16 | Discharge: 2018-06-16 | Disposition: A | Discharge status: Home / Self Care | Payer: No Typology Code available for payment source | Source: Ambulatory Visit | Attending: Family | Admitting: Family

## 2018-06-16 DIAGNOSIS — N6459 Other signs and symptoms in breast: Secondary | ICD-10-CM

## 2018-06-16 DIAGNOSIS — R921 Mammographic calcification found on diagnostic imaging of breast: Secondary | ICD-10-CM

## 2018-06-23 ENCOUNTER — Ambulatory Visit
Admission: RE | Admit: 2018-06-23 | Discharge: 2018-06-23 | Disposition: A | Discharge status: Home / Self Care | Payer: No Typology Code available for payment source | Source: Ambulatory Visit | Attending: Family | Admitting: Family

## 2018-06-23 DIAGNOSIS — R921 Mammographic calcification found on diagnostic imaging of breast: Secondary | ICD-10-CM

## 2018-07-13 ENCOUNTER — Other Ambulatory Visit: Payer: Self-pay | Admitting: Family

## 2018-12-08 ENCOUNTER — Encounter: Payer: Self-pay | Admitting: Family

## 2018-12-12 ENCOUNTER — Ambulatory Visit
Admission: RE | Admit: 2018-12-12 | Discharge: 2018-12-12 | Disposition: A | Discharge status: Home / Self Care | Payer: No Typology Code available for payment source | Source: Ambulatory Visit | Attending: Family | Admitting: Family

## 2018-12-12 ENCOUNTER — Other Ambulatory Visit: Payer: Self-pay | Admitting: Family

## 2018-12-12 ENCOUNTER — Other Ambulatory Visit: Payer: Self-pay

## 2018-12-12 DIAGNOSIS — N6459 Other signs and symptoms in breast: Secondary | ICD-10-CM

## 2018-12-12 DIAGNOSIS — N6320 Unspecified lump in the left breast, unspecified quadrant: Secondary | ICD-10-CM

## 2019-01-06 ENCOUNTER — Encounter: Payer: Self-pay | Admitting: Family

## 2019-01-06 MED ORDER — LEVONORGESTREL-ETHINYL ESTRAD 0.1-20 MG-MCG PO TABS: 1.0000 | ORAL_TABLET | Freq: Every day | ORAL | 1 refills | Status: DC

## 2019-06-16 ENCOUNTER — Ambulatory Visit
Admission: RE | Admit: 2019-06-16 | Discharge: 2019-06-16 | Disposition: A | Discharge status: Home / Self Care | Payer: No Typology Code available for payment source | Source: Ambulatory Visit | Attending: Family | Admitting: Family

## 2019-06-16 ENCOUNTER — Other Ambulatory Visit: Payer: Self-pay

## 2019-06-16 DIAGNOSIS — N6320 Unspecified lump in the left breast, unspecified quadrant: Secondary | ICD-10-CM

## 2019-06-22 ENCOUNTER — Other Ambulatory Visit: Payer: Self-pay | Admitting: Family

## 2019-06-22 ENCOUNTER — Telehealth: Payer: Self-pay | Admitting: Family

## 2019-07-02 ENCOUNTER — Other Ambulatory Visit: Payer: Self-pay

## 2019-07-02 ENCOUNTER — Encounter: Payer: Self-pay | Admitting: Family

## 2019-07-02 MED ORDER — LEVONORGESTREL-ETHINYL ESTRAD 0.1-20 MG-MCG PO TABS: 1.0000 | ORAL_TABLET | Freq: Every day | ORAL | 0 refills | Status: DC

## 2019-07-21 ENCOUNTER — Encounter: Payer: No Typology Code available for payment source | Admitting: Family

## 2019-08-04 ENCOUNTER — Telehealth: Payer: Self-pay | Admitting: Family

## 2019-08-04 MED ORDER — LEVONORGESTREL-ETHINYL ESTRAD 0.1-20 MG-MCG PO TABS: 1.0000 | ORAL_TABLET | Freq: Every day | ORAL | 0 refills | Status: DC

## 2019-08-04 NOTE — Telephone Encounter (Signed)
Had to RS pts CPE from 08/05/2019 as provider is virtual only and not covered by insurance. Pt needing refill on levonorgestrel-ethinyl estradiol (LUTERA) 0.1-20 MG-MCG tablet .  New cpe date is 08/24/2018.  [place], [place] - [address] [place] Phone:  [phone]  Fax:  [phone]

## 2019-08-05 ENCOUNTER — Encounter: Payer: No Typology Code available for payment source | Admitting: Family

## 2019-08-25 ENCOUNTER — Other Ambulatory Visit: Payer: Self-pay

## 2019-08-25 ENCOUNTER — Ambulatory Visit (INDEPENDENT_AMBULATORY_CARE_PROVIDER_SITE_OTHER): Payer: PRIVATE HEALTH INSURANCE | Admitting: Family

## 2019-08-25 ENCOUNTER — Encounter: Payer: No Typology Code available for payment source | Admitting: Family

## 2019-08-25 ENCOUNTER — Encounter: Payer: Self-pay | Admitting: Family

## 2019-08-25 VITALS — BP 120/74 | HR 77 | Temp 97.6°F | Resp 16 | Ht 66.0 in | Wt 127.0 lb

## 2019-08-25 DIAGNOSIS — Z0001 Encounter for general adult medical examination with abnormal findings: Secondary | ICD-10-CM | POA: Diagnosis not present

## 2019-08-25 DIAGNOSIS — R21 Rash and other nonspecific skin eruption: Secondary | ICD-10-CM

## 2019-08-25 DIAGNOSIS — Z0000 Encounter for general adult medical examination without abnormal findings: Secondary | ICD-10-CM

## 2019-08-25 MED ORDER — LORATADINE 10 MG PO TABS: 10.0000 mg | ORAL_TABLET | Freq: Every day | ORAL | 11 refills | Status: DC

## 2019-08-25 MED ORDER — LEVONORGESTREL-ETHINYL ESTRAD 0.1-20 MG-MCG PO TABS: 1.0000 | ORAL_TABLET | Freq: Every day | ORAL | 4 refills | Status: DC

## 2019-08-25 NOTE — Patient Instructions (Addendum)
Switch detergent to "Free and Clear" Switch to Cetaphil body wash and moisturizer. Let me know if your rash symptoms worsen or if they fail to improve.  Please complete lab work prior to leaving.

## 2019-08-25 NOTE — Progress Notes (Signed)
Subjective:    Patient ID: Gina Butler, female    DOB: [birth year], 39 y.o.   MRN: [id number]  HPI  Patient presents today for complete physical.  Immunizations: tetanus up to date Diet: healthy Exercise: 2-3 times a week Pap Smear: 11/19 Vision: up to date Dental: up to date Wt Readings from Last 3 Encounters:  08/25/19 127 lb (57.6 kg)  06/02/18 124 lb (56.2 kg)  03/08/17 125 lb 9.6 oz (57 kg)    Pt reports intermittent skin itching with occasional hives. Uses loratidine PRN.  Then it starts again. Switched soap/detergent.     Review of Systems  Constitutional: Negative for unexpected weight change.  HENT: Negative for hearing loss and rhinorrhea.   Eyes: Negative for visual disturbance.  Respiratory: Negative for cough.   Cardiovascular: Negative for leg swelling.  Gastrointestinal: Negative for blood in stool and constipation.  Genitourinary: Negative for dysuria, frequency and hematuria.  Musculoskeletal: Negative for arthralgias and myalgias.  Skin: Positive for rash (intermittent).  Neurological: Negative for headaches.  Hematological: Negative for adenopathy.  Psychiatric/Behavioral:       Denies depression/anxiety   Past Medical History:  Diagnosis Date  . Arthritis   . History of chicken pox   . Subclinical hypothyroidism 06/06/2018     Social History   Socioeconomic History  . Marital status: Married    Spouse name: Not on file  . Number of children: 0  . Years of education: Not on file  . Highest education level: Not on file  Occupational History    Employer: [employer]  Tobacco Use  . Smoking status: Never Smoker  . Smokeless tobacco: Never Used  Substance and Sexual Activity  . Alcohol use: Yes    Alcohol/week: 2.0 - 4.0 standard drinks    Types: 2 - 4 Standard drinks or equivalent per week  . Drug use: No  . Sexual activity: Not on file  Other Topics Concern  . 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 Difficulty of Paying Living Expenses: Not on file  Food Insecurity:   . Worried About Charity fundraiser in the Last Year: Not on file  . Ran Out of Food in the Last Year: Not on file  Transportation Needs:   . Lack of Transportation (Medical): Not on file  . Lack of Transportation (Non-Medical): Not on file  Physical Activity:   . Days of Exercise per Week: Not on file  . Minutes of Exercise per Session: Not on file  Stress:   . Feeling of Stress : Not on file  Social Connections:   . Frequency of Communication with Friends and Family: Not on file  . Frequency of Social Gatherings with Friends and Family: Not on file  . Attends Religious Services: Not on file  . Active Member of Clubs or Organizations: Not on file  . Attends Archivist Meetings: Not on file  . Marital Status: Not on file  Intimate Partner Violence:   . Fear of Current or Ex-Partner: Not on file  . Emotionally Abused: Not on file  . Physically Abused: Not on file  . Sexually Abused: Not on file    Past Surgical History:  Procedure Laterality Date  . deep tissue biopsy Right 01/27/09   ankle.  "  arthritis"  . OTHER SURGICAL HISTORY Right 2010   ankle-- deep tissue biopsy    Family History  Problem Relation Age of Onset  . Arthritis Mother        osteoarthritis  . Cancer Maternal Aunt        breast  . Breast cancer Maternal Aunt   . Arthritis Maternal Grandmother   . Stroke Maternal Grandfather   . Cancer Maternal Grandfather 94       colon  . Schizophrenia Maternal Aunt   . Skin cancer Father     Allergies  Allergen Reactions  . Compazine [Prochlorperazine Edisylate] Other (See Comments)    "Affected the muscles in my neck, could not move my head."  . Sulfa Antibiotics     Childhood reaction    Current Outpatient Medications on File Prior  to Visit  Medication Sig Dispense Refill  . levonorgestrel-ethinyl estradiol (LUTERA) 0.1-20 MG-MCG tablet Take 1 tablet by mouth daily. 1 Package 0   No current facility-administered medications on file prior to visit.    BP 120/74 (BP Location: Right Arm, Patient Position: Sitting, Cuff Size: Small)   Pulse 77   Temp 97.6 F (36.4 C) (Temporal)   Resp 16   Ht 5\' 6"  (1.676 m)   Wt 127 lb (57.6 kg)   LMP 08/10/2019   SpO2 100%   BMI 20.50 kg/m       Objective:   Physical Exam  Physical Exam  Constitutional: She is oriented to person, place, and time. She appears well-developed and well-nourished. No distress.  HENT:  Head: Normocephalic and atraumatic.  Right Ear: Tympanic membrane and ear canal normal.  Left Ear: Tympanic membrane and ear canal normal.  Mouth/Throat: not examined- pt wearing mask Eyes: Pupils are equal, round, and reactive to light. No scleral icterus.  Neck: Normal range of motion. No thyromegaly present.  Cardiovascular: Normal rate and regular rhythm.   No murmur heard. Pulmonary/Chest: Effort normal and breath sounds normal. No respiratory distress. He has no wheezes. She has no rales. She exhibits no tenderness.  Abdominal: Soft. Bowel sounds are normal. She exhibits no distension and no mass. There is no tenderness. There is no rebound and no guarding.  Musculoskeletal: She exhibits no edema.  Lymphadenopathy:    She has no cervical adenopathy.  Neurological: She is alert and oriented to person, place, and time. She has normal patellar reflexes. She exhibits normal muscle tone. Coordination normal.  Skin: Skin is warm and dry.  Psychiatric: She has a normal mood and affect. Her behavior is normal. Judgment and thought content normal.  Breasts: Examined lying Right: Without masses, retractions, discharge or axillary adenopathy. R nipple inversion Left: Without masses, retractions, discharge or axillary adenopathy.  Pelvic: deferred           Assessment & Plan:   Preventative care- discussed healthy diet and exercise. Immunizations reviewed and up to date.  Pap up to date, mammo up to date.  Skin rash- no rash today. Recommended following:    Switch detergent to "Free and Clear" Switch to Cetaphil body wash and moisturizer. She will let me know if her symptoms worsen or fail to improve. Would plan referral to allergist at that time.  This visit occurred during the SARS-CoV-2 public health emergency.  Safety protocols were in place, including screening questions prior to the visit, additional usage of staff PPE, and extensive cleaning of exam room while observing appropriate contact time as indicated for disinfecting solutions.  Assessment & Plan:

## 2019-08-26 LAB — CBC WITH DIFFERENTIAL/PLATELET
Basophils Absolute: 0.1 10*3/uL (ref 0.0–0.1)
Basophils Relative: 0.7 % (ref 0.0–3.0)
Eosinophils Absolute: 0 10*3/uL (ref 0.0–0.7)
Eosinophils Relative: 0.4 % (ref 0.0–5.0)
HCT: 39.9 % (ref 36.0–46.0)
Hemoglobin: 13.4 g/dL (ref 12.0–15.0)
Lymphocytes Relative: 19.1 % (ref 12.0–46.0)
Lymphs Abs: 1.4 10*3/uL (ref 0.7–4.0)
MCHC: 33.6 g/dL (ref 30.0–36.0)
MCV: 86.8 fl (ref 78.0–100.0)
Monocytes Absolute: 0.6 10*3/uL (ref 0.1–1.0)
Monocytes Relative: 8.8 % (ref 3.0–12.0)
Neutro Abs: 5.2 10*3/uL (ref 1.4–7.7)
Neutrophils Relative %: 71 % (ref 43.0–77.0)
Platelets: 237 10*3/uL (ref 150.0–400.0)
RBC: 4.59 Mil/uL (ref 3.87–5.11)
RDW: 12.5 % (ref 11.5–15.5)
WBC: 7.3 10*3/uL (ref 4.0–10.5)

## 2019-08-26 LAB — LIPID PANEL
Cholesterol: 166 mg/dL (ref 0–200)
HDL: 59.9 mg/dL (ref >39.00)
LDL Cholesterol: 91 mg/dL (ref 0–99)
NonHDL: 105.76
Total CHOL/HDL Ratio: 3
Triglycerides: 72 mg/dL (ref 0.0–149.0)
VLDL: 14.4 mg/dL (ref 0.0–40.0)

## 2019-08-26 LAB — HEPATIC FUNCTION PANEL
ALT: 11 U/L (ref 0–35)
AST: 14 U/L (ref 0–37)
Albumin: 4.1 g/dL (ref 3.5–5.2)
Alkaline Phosphatase: 54 U/L (ref 39–117)
Bilirubin, Direct: 0.1 mg/dL (ref 0.0–0.3)
Total Bilirubin: 0.5 mg/dL (ref 0.2–1.2)
Total Protein: 6.8 g/dL (ref 6.0–8.3)

## 2019-08-26 LAB — BASIC METABOLIC PANEL
BUN: 7 mg/dL (ref 6–23)
CO2: 28 mEq/L (ref 19–32)
Calcium: 9 mg/dL (ref 8.4–10.5)
Chloride: 104 mEq/L (ref 96–112)
Creatinine, Ser: 0.75 mg/dL (ref 0.40–1.20)
GFR: 87.19 mL/min (ref >60.00)
Glucose, Bld: 107 mg/dL — ABNORMAL HIGH (ref 70–99)
Potassium: 3.8 mEq/L (ref 3.5–5.1)
Sodium: 138 mEq/L (ref 135–145)

## 2019-08-26 LAB — TSH: TSH: 2.22 u[IU]/mL (ref 0.35–4.50)

## 2020-01-11 ENCOUNTER — Encounter: Payer: Self-pay | Admitting: Family

## 2020-01-11 NOTE — Telephone Encounter (Signed)
Appt scheduled

## 2020-01-13 ENCOUNTER — Other Ambulatory Visit: Payer: Self-pay

## 2020-01-13 ENCOUNTER — Ambulatory Visit: Payer: PRIVATE HEALTH INSURANCE | Admitting: Family

## 2020-01-13 ENCOUNTER — Encounter: Payer: Self-pay | Admitting: Family

## 2020-01-13 VITALS — BP 104/70 | HR 77 | Temp 97.9°F | Resp 16 | Ht 66.0 in | Wt 128.0 lb

## 2020-01-13 DIAGNOSIS — R300 Dysuria: Secondary | ICD-10-CM

## 2020-01-13 LAB — POC URINALSYSI DIPSTICK (AUTOMATED)
Bilirubin, UA: NEGATIVE
Glucose, UA: NEGATIVE
Ketones, UA: NEGATIVE
Leukocytes, UA: NEGATIVE
Nitrite, UA: NEGATIVE
Protein, UA: POSITIVE — AB
Spec Grav, UA: 1.025 (ref 1.010–1.025)
Urobilinogen, UA: 0.2 E.U./dL
pH, UA: 5.5 (ref 5.0–8.0)

## 2020-01-13 MED ORDER — CEPHALEXIN 500 MG PO CAPS: 500.0000 mg | ORAL_CAPSULE | Freq: Three times a day (TID) | ORAL | 0 refills | Status: DC

## 2020-01-13 NOTE — Patient Instructions (Signed)
Please start keflex.  Call if symptoms worsen or if you are not feeling better in 2-3 days.

## 2020-01-13 NOTE — Progress Notes (Signed)
Subjective:    Patient ID: Gina Butler, female    DOB: 1981/07/27, 39 y.o.   MRN: [id number]  HPI  Patient is a 39 yr old female who presents today for UTI symptoms. Symptoms (including dysuria) started 2 weeks ago. She took 5 days of macrobid. Dysuria resolved but urgency persisted.  Reports that she woke up Monday AM 6/14 and "all of a sudden it was back."  Reports that she saw hematuria on Monday AM.  Hematuria has resolved but dysuria persists.   Review of Systems See HPI  Past Medical History:  Diagnosis Date  . Arthritis   . History of chicken pox   . Subclinical hypothyroidism 06/06/2018     Social History   Socioeconomic History  . Marital status: Married    Spouse name: Not on file  . Number of children: 0  . Years of education: Not on file  . Highest education level: Not on file  Occupational History    Employer: [employer]  Tobacco Use  . Smoking status: Never Smoker  . Smokeless tobacco: Never Used  Vaping Use  . Vaping Use: Never used  Substance and Sexual Activity  . Alcohol use: Yes    Alcohol/week: 2.0 - 4.0 standard drinks    Types: 2 - 4 Standard drinks or equivalent per week  . Drug use: No  . Sexual activity: Yes    Partners: Male    Birth control/protection: Pill  Other Topics Concern  . 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 Difficulty of Paying Living Expenses:   Food Insecurity:   . Worried About Charity fundraiser in the Last Year:   . Arboriculturist in the Last Year:   Transportation Needs:   . Film/video editor (Medical):   Marland Kitchen Lack of Transportation (Non-Medical):   Physical Activity:   . Days of Exercise per Week:   . Minutes of Exercise per Session:   Stress:   . Feeling of Stress :   Social Connections:   . Frequency of Communication with  Friends and Family:   . Frequency of Social Gatherings with Friends and Family:   . Attends Religious Services:   . Active Member of Clubs or Organizations:   . Attends Archivist Meetings:   Marland Kitchen Marital Status:   Intimate Partner Violence:   . Fear of Current or Ex-Partner:   . Emotionally Abused:   Marland Kitchen Physically Abused:   . Sexually Abused:     Past Surgical History:  Procedure Laterality Date  . deep tissue biopsy Right 01/27/09   ankle.  "arthritis"  . OTHER SURGICAL HISTORY Right 2010   ankle-- deep tissue biopsy    Family History  Problem Relation Age of Onset  . Arthritis Mother        osteoarthritis  . Cancer Maternal Aunt        breast  . Breast cancer Maternal Aunt   . Arthritis Maternal Grandmother   . Stroke Maternal Grandfather   . Cancer Maternal Grandfather 94       colon  . Schizophrenia Maternal Aunt   . Skin cancer Father   . Obesity Sister     Allergies  Allergen Reactions  . Compazine [Prochlorperazine Edisylate] Other (  See Comments)    "Affected the muscles in my neck, could not move my head."  . Sulfa Antibiotics     Childhood reaction    Current Outpatient Medications on File Prior to Visit  Medication Sig Dispense Refill  . levonorgestrel-ethinyl estradiol (LUTERA) 0.1-20 MG-MCG tablet Take 1 tablet by mouth daily. 3 Package 4  . loratadine (CLARITIN) 10 MG tablet Take 1 tablet (10 mg total) by mouth daily. 30 tablet 11   No current facility-administered medications on file prior to visit.    BP 104/70 (BP Location: Left Arm, Patient Position: Sitting, Cuff Size: Normal)   Pulse 77   Temp 97.9 F (36.6 C) (Temporal)   Resp 16   Ht 5\' 6"  (1.676 m)   Wt 128 lb (58.1 kg)   SpO2 97%   BMI 20.66 kg/m       Objective:   Physical Exam Constitutional:      Appearance: She is well-developed.  Neck:     Thyroid: No thyromegaly.  Cardiovascular:     Rate and Rhythm: Normal rate and regular rhythm.     Heart sounds: Normal  heart sounds. No murmur heard.   Pulmonary:     Effort: Pulmonary effort is normal. No respiratory distress.     Breath sounds: Normal breath sounds. No wheezing.  Abdominal:     General: Bowel sounds are normal.     Tenderness: There is no abdominal tenderness. There is no right CVA tenderness or left CVA tenderness.  Musculoskeletal:     Cervical back: Neck supple.  Skin:    General: Skin is warm and dry.  Neurological:     Mental Status: She is alert and oriented to person, place, and time.  Psychiatric:        Behavior: Behavior normal.        Thought Content: Thought content normal.        Judgment: Judgment normal.           Assessment & Plan:  UTI- UA clean but clinically symptomatic. Will send urine for culture and will begin empiric keflex.  Patient is advised to call if symptoms worsen or if symptoms fail to improve.  This visit occurred during the SARS-CoV-2 public health emergency.  Safety protocols were in place, including screening questions prior to the visit, additional usage of staff PPE, and extensive cleaning of exam room while observing appropriate contact time as indicated for disinfecting solutions.

## 2020-01-15 LAB — URINE CULTURE
MICRO NUMBER:: 10598117
SPECIMEN QUALITY:: ADEQUATE

## 2020-07-30 HISTORY — PX: MOHS SURGERY: SUR867

## 2020-09-19 NOTE — Telephone Encounter (Signed)
Encounter opened in error

## 2020-11-05 ENCOUNTER — Encounter: Payer: Self-pay | Admitting: Family

## 2020-11-07 ENCOUNTER — Other Ambulatory Visit: Payer: Self-pay

## 2020-11-07 MED ORDER — LEVONORGESTREL-ETHINYL ESTRAD 0.1-20 MG-MCG PO TABS: 1.0000 | ORAL_TABLET | Freq: Every day | ORAL | 0 refills | Status: DC

## 2020-12-02 ENCOUNTER — Encounter: Payer: Self-pay | Admitting: Family

## 2020-12-02 ENCOUNTER — Other Ambulatory Visit: Payer: Self-pay

## 2020-12-02 ENCOUNTER — Ambulatory Visit (INDEPENDENT_AMBULATORY_CARE_PROVIDER_SITE_OTHER): Payer: PRIVATE HEALTH INSURANCE | Admitting: Family

## 2020-12-02 VITALS — BP 106/73 | HR 78 | Temp 98.7°F | Resp 16 | Ht 66.0 in | Wt 123.0 lb

## 2020-12-02 DIAGNOSIS — L989 Disorder of the skin and subcutaneous tissue, unspecified: Secondary | ICD-10-CM | POA: Diagnosis not present

## 2020-12-02 DIAGNOSIS — Z0000 Encounter for general adult medical examination without abnormal findings: Secondary | ICD-10-CM | POA: Diagnosis not present

## 2020-12-02 DIAGNOSIS — R739 Hyperglycemia, unspecified: Secondary | ICD-10-CM

## 2020-12-02 DIAGNOSIS — Z8639 Personal history of other endocrine, nutritional and metabolic disease: Secondary | ICD-10-CM

## 2020-12-02 DIAGNOSIS — E038 Other specified hypothyroidism: Secondary | ICD-10-CM

## 2020-12-02 DIAGNOSIS — L509 Urticaria, unspecified: Secondary | ICD-10-CM | POA: Diagnosis not present

## 2020-12-02 LAB — BASIC METABOLIC PANEL
BUN: 9 mg/dL (ref 6–23)
CO2: 27 mEq/L (ref 19–32)
Calcium: 8.8 mg/dL (ref 8.4–10.5)
Chloride: 103 mEq/L (ref 96–112)
Creatinine, Ser: 0.86 mg/dL (ref 0.40–1.20)
GFR: 86.09 mL/min (ref >60.00)
Glucose, Bld: 88 mg/dL (ref 70–99)
Potassium: 3.9 mEq/L (ref 3.5–5.1)
Sodium: 138 mEq/L (ref 135–145)

## 2020-12-02 LAB — TSH: TSH: 1.92 u[IU]/mL (ref 0.35–4.50)

## 2020-12-02 MED ORDER — LEVONORGESTREL-ETHINYL ESTRAD 0.1-20 MG-MCG PO TABS: 1.0000 | ORAL_TABLET | Freq: Every day | ORAL | 3 refills | Status: DC

## 2020-12-02 NOTE — Assessment & Plan Note (Signed)
Obtain follow up TSH.  

## 2020-12-02 NOTE — Assessment & Plan Note (Signed)
Intermittent. Requests referral to allergist. Referral placed. Continue claritin 10mg  daily and fragrance free products.

## 2020-12-02 NOTE — Patient Instructions (Signed)
Please complete lab work prior to leaving.   

## 2020-12-02 NOTE — Progress Notes (Signed)
Subjective:   By signing my name below, I, Shehryar Baig, attest that this documentation has been prepared under the direction and in the presence of Debbrah Alar NP. 12/02/2020      Patient ID: Gina Butler, female    DOB: 23-Jan-1981, 40 y.o.   MRN: [id number]  No chief complaint on file.   HPI Patient is in today for a comprehensive physical exam. She notes a spot on her right forearm  where her skin color fluctuates. She is requesting to see another provider to diagnose her allergies. She denies having cough, cold symptoms, digestion issues, constipation, diarrhea, burning, frequency, menstrual issues, skin rashes, headache, anxiety, depression. She has no changes in family medical history this year. She has had no surgeries this past year.  Immunizations: She has had 3 Covid 19 pfizer vaccinations. She has had a flu shot in the fall. She is UTD on tetanus vaccinations. Diet: She is eating healthy. Exercise: She participates in exercise by walking her dog 5-6 days a week. Pap Smear: Last completed 06/02/2018. Results normal. Repeat in 3 years. She will wait until November, 2022 to get an appointment. Mammogram: Last completed 06/16/2018. She is willing to make an appointment to  Dental: She is due on dental exams. Vision: She is UTD on vision exams.   Past Medical History:  Diagnosis Date  . Arthritis   . History of chicken pox   . Subclinical hypothyroidism 06/06/2018    Past Surgical History:  Procedure Laterality Date  . deep tissue biopsy Right 01/27/09   ankle.  "arthritis"  . OTHER SURGICAL HISTORY Right 2010   ankle-- deep tissue biopsy    Family History  Problem Relation Age of Onset  . Arthritis Mother        osteoarthritis  . Cancer Maternal Aunt        breast  . Breast cancer Maternal Aunt   . Arthritis Maternal Grandmother   . Stroke Maternal Grandfather   . Cancer Maternal Grandfather 94       colon  . Schizophrenia Maternal Aunt   . Skin  cancer Father   . Obesity Sister     Social History   Socioeconomic History  . Marital status: Married    Spouse name: Not on file  . Number of children: 0  . Years of education: Not on file  . Highest education level: Not on file  Occupational History    Employer: [employer]  Tobacco Use  . Smoking status: Never Smoker  . Smokeless tobacco: Never Used  Vaping Use  . Vaping Use: Never used  Substance and Sexual Activity  . Alcohol use: Yes    Alcohol/week: 2.0 - 4.0 standard drinks    Types: 2 - 4 Standard drinks or equivalent per week  . Drug use: No  . Sexual activity: Yes    Partners: Male    Birth control/protection: Pill  Other Topics Concern  . Not on file  Social History Narrative   Married   No children   Dietitian at [employer]   Enjoys cooking         Social Determinants of Radio broadcast assistant Strain: Not on file  Food Insecurity: Not on file  Transportation Needs: Not on file  Physical Activity: Not on file  Stress: Not on file  Social Connections: Not on file  Intimate Partner Violence: Not on file    Outpatient Medications Prior to Visit  Medication Sig Dispense  Refill  . cephALEXin (KEFLEX) 500 MG capsule Take 1 capsule (500 mg total) by mouth 3 (three) times daily. 15 capsule 0  . levonorgestrel-ethinyl estradiol (LUTERA) 0.1-20 MG-MCG tablet Take 1 tablet by mouth daily. 90 tablet 0  . loratadine (CLARITIN) 10 MG tablet Take 1 tablet (10 mg total) by mouth daily. 30 tablet 11   No facility-administered medications prior to visit.    Allergies  Allergen Reactions  . Compazine [Prochlorperazine Edisylate] Other (See Comments)    "Affected the muscles in my neck, could not move my head."  . Sulfa Antibiotics     Childhood reaction    Review of Systems  Constitutional: Negative for weight loss.  HENT: Negative for congestion.   Respiratory: Negative for cough.   Cardiovascular: Negative for leg  swelling.  Gastrointestinal: Negative for constipation and diarrhea.  Genitourinary: Negative for dysuria and frequency.  Skin: Negative for rash.  Neurological: Negative for headaches.  Psychiatric/Behavioral:       Denies depression/anxiety       Objective:    Physical Exam Constitutional:      Appearance: Normal appearance.  HENT:     Head: Normocephalic and atraumatic.     Right Ear: External ear normal.     Left Ear: External ear normal.  Eyes:     Extraocular Movements: Extraocular movements intact.     Pupils: Pupils are equal, round, and reactive to light.     Comments: No nystagmus  Cardiovascular:     Rate and Rhythm: Normal rate and regular rhythm.     Pulses: Normal pulses.     Heart sounds: Normal heart sounds. No murmur heard. No friction rub. No gallop.   Pulmonary:     Effort: Pulmonary effort is normal. No respiratory distress.     Breath sounds: Normal breath sounds. No stridor. No wheezing, rhonchi or rales.  Chest:  Breasts:     Right: Normal.     Left: Normal.    Abdominal:     General: Bowel sounds are normal. There is no distension.     Palpations: Abdomen is soft. There is no mass.     Tenderness: There is no abdominal tenderness. There is no guarding or rebound.     Hernia: No hernia is present.  Musculoskeletal:     Comments: 5/5 strength in both upper and lower extremities.  Skin:    General: Skin is warm and dry.     Comments: Dry raised skin lesion left temple near scalp line.  Raised pink lesion right forearm  Neurological:     Mental Status: She is alert and oriented to person, place, and time.     Comments: Normal patellar reflexes.  Psychiatric:        Behavior: Behavior normal.     There were no vitals taken for this visit. Wt Readings from Last 3 Encounters:  01/13/20 128 lb (58.1 kg)  08/25/19 127 lb (57.6 kg)  06/02/18 124 lb (56.2 kg)    Diabetic Foot Exam - Simple   No data filed    Lab Results  Component  Value Date   WBC 7.3 08/25/2019   HGB 13.4 08/25/2019   HCT 39.9 08/25/2019   PLT 237.0 08/25/2019   GLUCOSE 107 (H) 08/25/2019   CHOL 166 08/25/2019   TRIG 72.0 08/25/2019   HDL 59.90 08/25/2019   LDLCALC 91 08/25/2019   ALT 11 08/25/2019   AST 14 08/25/2019   NA 138 08/25/2019   K 3.8 08/25/2019  CL 104 08/25/2019   CREATININE 0.75 08/25/2019   BUN 7 08/25/2019   CO2 28 08/25/2019   TSH 2.22 08/25/2019    Lab Results  Component Value Date   TSH 2.22 08/25/2019   Lab Results  Component Value Date   WBC 7.3 08/25/2019   HGB 13.4 08/25/2019   HCT 39.9 08/25/2019   MCV 86.8 08/25/2019   PLT 237.0 08/25/2019   Lab Results  Component Value Date   NA 138 08/25/2019   K 3.8 08/25/2019   CO2 28 08/25/2019   GLUCOSE 107 (H) 08/25/2019   BUN 7 08/25/2019   CREATININE 0.75 08/25/2019   BILITOT 0.5 08/25/2019   ALKPHOS 54 08/25/2019   AST 14 08/25/2019   ALT 11 08/25/2019   PROT 6.8 08/25/2019   ALBUMIN 4.1 08/25/2019   CALCIUM 9.0 08/25/2019   GFR 87.19 08/25/2019   Lab Results  Component Value Date   CHOL 166 08/25/2019   Lab Results  Component Value Date   HDL 59.90 08/25/2019   Lab Results  Component Value Date   LDLCALC 91 08/25/2019   Lab Results  Component Value Date   TRIG 72.0 08/25/2019   Lab Results  Component Value Date   CHOLHDL 3 08/25/2019   No results found for: HGBA1C     Assessment & Plan:   Problem List Items Addressed This Visit   None      No orders of the defined types were placed in this encounter.   I, Debbrah Alar, personally preformed the services described in this documentation.  All medical record entries made by the scribe were at my direction and in my presence.  I have reviewed the chart and discharge instructions (if applicable) and agree that the record reflects my personal performance and is accurate and complete. 12/02/2020   I,Shehryar Baig,acting as a Education administrator for Nance Pear, NP.,have  documented all relevant documentation on the behalf of Nance Pear, NP,as directed by  Nance Pear, NP while in the presence of Nance Pear, NP.   Shehryar [employer]

## 2020-12-02 NOTE — Assessment & Plan Note (Signed)
Encouraged pt to continue healthy diet and regular exercise.  She also plans to schedule her diagnostic mammogram/US at the [facility] which she knows is overdue.  Immunizations reviewed and up to date. Pap will be due in November. Offered to complete today- she prefers to hold off for now.

## 2020-12-02 NOTE — Assessment & Plan Note (Signed)
Refer to dermatology for skin evaluation.

## 2020-12-19 ENCOUNTER — Telehealth: Payer: Self-pay | Admitting: Dermatology

## 2020-12-19 NOTE — Telephone Encounter (Signed)
Referral opened back up. Notes documented and referral routed back to referring office.

## 2020-12-19 NOTE — Telephone Encounter (Signed)
Referral from Dr Inda Castle, [facility], doesn't want to wait until November; please request referrer try to get her in elsewhere sooner.

## 2021-02-02 ENCOUNTER — Encounter: Payer: Self-pay | Admitting: Allergy & Immunology

## 2021-02-02 ENCOUNTER — Other Ambulatory Visit: Payer: Self-pay

## 2021-02-02 ENCOUNTER — Ambulatory Visit: Payer: No Typology Code available for payment source | Admitting: Allergy & Immunology

## 2021-02-02 VITALS — BP 102/74 | HR 89 | Temp 98.2°F | Resp 15 | Ht 66.0 in | Wt 128.4 lb

## 2021-02-02 DIAGNOSIS — J3089 Other allergic rhinitis: Secondary | ICD-10-CM | POA: Diagnosis not present

## 2021-02-02 DIAGNOSIS — J45990 Exercise induced bronchospasm: Secondary | ICD-10-CM | POA: Diagnosis not present

## 2021-02-02 DIAGNOSIS — J302 Other seasonal allergic rhinitis: Secondary | ICD-10-CM

## 2021-02-02 DIAGNOSIS — L508 Other urticaria: Secondary | ICD-10-CM | POA: Diagnosis not present

## 2021-02-02 MED ORDER — FAMOTIDINE 20 MG PO TABS: ORAL_TABLET | ORAL | 5 refills | Status: DC

## 2021-02-02 NOTE — Progress Notes (Signed)
NEW PATIENT  Date of Service/Encounter:  02/02/21  Consult requested by: Debbrah Alar, NP   Assessment:   Seasonal and perennial allergic rhinitis (grasses, weeds, trees, indoor molds, outdoor molds, dust mites, cat, and cockroach)  Chronic urticaria  Plan/Recommendations:   1. Chronic urticaria - You have several reasons for hives and sensitive skin on your testing, so we are going to aggressively address your allergic rhinitis to see if this helps at all. - Thankfully, there are no red flags for serious causes of hives and swelling. - I think we can hold off on extensive blood work at this time, but we may consider it in the future. - In the meantime, stop the Claritin (loratadine). - Start Zyrtec (cetirizine) 10 mg plus Pepcid (famotidine) 20mg  TWICE DAILY to stay ahead of the itching/hives. - Zyrtec can RARELY cause sedation, so they are aware of that. - MOST people tolerated just fine.  2. Seasonal and perennial allergic rhinitis - Testing today showed: grasses, weeds, trees, indoor molds, outdoor molds, dust mites, cat, and cockroach - Copy of test results provided.  - Avoidance measures provided. - Stop taking: Claritin (loratadine)  - Start taking: antihistamines as above - You can use an extra dose of the antihistamine, if needed, for breakthrough symptoms.  - Consider nasal saline rinses 1-2 times daily to remove allergens from the nasal cavities as well as help with mucous clearance (this is especially helpful to do before the nasal sprays are given) - Consider allergy shots as a means of long-term control. - Allergy shots "re-train" and "reset" the immune system to ignore environmental allergens and decrease the resulting immune response to those allergens (sneezing, itchy watery eyes, runny nose, nasal congestion, etc).    - Allergy shots improve symptoms in 75-85% of patients.  - We can discuss more at the next appointment if the medications are not working  for you. - If the environmental allergies are contributing to your itching, which we will know by the next visit, allergy shots could help with the itching/hives.  3.  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 - I do not think there is a need for controller medication.  4. Return in about 6 weeks (around 03/16/2021).     This note in its entirety was forwarded to the Provider who requested this consultation.  Subjective:   Gina Butler is a 40 y.o. female presenting today for evaluation of  Chief Complaint  Patient presents with   Urticaria   Shortness of Breath    Difficult breathing while exercising.    Gina Butler has a history of the following: Patient Active Problem List   Diagnosis Date Noted   Urticaria 12/02/2020   Skin lesion 12/02/2020   Subclinical hypothyroidism 06/06/2018   Preventative health care 01/05/2013   Inflammatory polyarthritis ([place]) 01/05/2013    History obtained from: chart review and patient.  Gina Butler was referred by Debbrah Alar, NP.     Gina Butler is a 40 y.o. female presenting for an evaluation of urticaria . She started having issues with urticaria in February 2020. She is unsure what she was doing, but they seemed to be triggered by scratching. Symptoms do not seem to worsen during a particular time of the year. She is unsure whether they have gotten worse during a time of the year. She has been on loratadine daily for these with some resolution. She has a history of arthritis. She is followed by Rheumatologist at [facility]. Now  her PCP is managing this. Her last outbreak was 2017. She has been on MTX and hydroxychloroquine. She just stopped taking it since it seemed to be stable. She had a flare in 2017 and was given steroids. She was restarted on her medications and then she got off of it around 6-8 months later. She had her first arthritis flare when she was in graduate school. She was diagnosed in 2010  officially. Hives have never been associated with her previous flares. She has not changed her diet. There are no known food triggers. She is fine with all of the major food allergens. She loves fish. Hives do not seem to resolve in the winter and she always seems to be sensitive. She did change to Free and Clear detergent. She has tried Human resources officer and this did not seem to work as well. She has not tried cetirizine to her knowledge  Asthma Symptom History: She has never been diagnosed with asthma, but she does endorse some issues with shortness of breath when she is exercising.  She reports this is more of a constriction within her throat and chest.  She has never needed anything for this.  She denies other symptoms of anaphylaxis.  She has never had an inhaler and begin physical activity seems to be the only trigger.  Allergic Rhinitis Symptom History: She has had seasonal allergies since moving to [facility]. She was in [place] and then [place] before that. She lived in the [place]. She came to [employer] for a degree in physics. She has a PhD in [employer]. 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 Symptoms overall are getting worse. She was particularly bad this year and shew was congested most of the time. She is taking loratadine 10mg  once daily. She has never doubled the dose. She has never used a nose spray. Symptoms do not go away during the summer as they normally do.   Otherwise, there is no history of other atopic diseases, including asthma, food allergies, drug allergies, stinging insect allergies, eczema, or contact dermatitis. There is no significant infectious history. Vaccinations are up to date.    Past Medical History: Patient Active Problem List   Diagnosis Date Noted   Urticaria 12/02/2020   Skin lesion 12/02/2020   Subclinical hypothyroidism 06/06/2018   Preventative health care 01/05/2013   Inflammatory polyarthritis  ([place]) 01/05/2013    Medication List:  Allergies as of 02/02/2021       Reactions   Compazine [prochlorperazine Edisylate] Other (See Comments)   "Affected the muscles in my neck, could not move my head."   Sulfa Antibiotics    Childhood reaction        Medication List        Accurate as of February 02, 2021 10:08 AM. If you have any questions, ask your nurse or doctor.          levonorgestrel-ethinyl estradiol 0.1-20 MG-MCG tablet Commonly known as: Lutera Take 1 tablet by mouth daily.   loratadine 10 MG tablet Commonly known as: CLARITIN Take 1 tablet (10 mg total) by mouth daily.        Birth History: non-contributory  Developmental History: non-contributory  Past Surgical History: Past Surgical History:  Procedure Laterality Date   deep tissue biopsy Right 01/27/09   ankle.  "arthritis"   OTHER SURGICAL HISTORY Right 2010   ankle-- deep tissue biopsy     Family History: Family History  Problem Relation Age of Onset   Arthritis Mother        osteoarthritis   Skin cancer Father    Obesity Sister    Urticaria Maternal Aunt    Cancer Maternal Aunt        breast   Breast cancer Maternal Aunt    Schizophrenia Maternal Aunt    Arthritis Maternal Grandmother    Stroke Maternal Grandfather    Cancer Maternal Grandfather 94       colon   Allergic rhinitis Neg Hx    Angioedema Neg Hx    Eczema Neg Hx    Immunodeficiency Neg Hx      Social History: Averiana lives at home with her family.  She is in a house that is approximately 38 years old.  There is carpeting throughout the home.  She has a heat pump for heating and central cooling.  There is a dog as well as a cat inside of the home.  He has had the cat for longer, but the been in the home for about 4 to 5 years or more.  There are no dust mite covers on the bedding.  There is no tobacco exposure.  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  Her graduate research was and field of biochemistry, specifically with the development of artificial cilia.  She has continued a lot of this research in her current job.  She is not exposed to fumes, chemicals, or dust.  She does not use a HEPA filter in her home.  She does not live near an interstate or industrial area.   Review of Systems  Constitutional: Negative.  Negative for chills, fever, malaise/fatigue and weight loss.  HENT:  Positive for congestion. Negative for ear discharge, ear pain and sinus pain.        Positive for postnasal drip.  Positive for sneezing.  Eyes:  Negative for pain, discharge and redness.  Respiratory:  Negative for cough, sputum production, shortness of breath and wheezing.   Cardiovascular: Negative.  Negative for chest pain and palpitations.  Gastrointestinal:  Negative for abdominal pain, constipation, diarrhea, heartburn, nausea and vomiting.  Musculoskeletal:        Positive for a history of arthritis.  Skin:  Positive for itching and rash.  Neurological:  Negative for dizziness and headaches.  Endo/Heme/Allergies:  Positive for environmental allergies. Does not bruise/bleed easily.      Objective:   Blood pressure 102/74, pulse 89, temperature 98.2 F (36.8 C), temperature source Temporal, resp. rate 15, height 5\' 6"  (1.676 m), weight 128 lb 6.4 oz (58.2 kg), SpO2 98 %. Body mass index is 20.72 kg/m.   Physical Exam:   Physical Exam Vitals reviewed.  Constitutional:      Appearance: She is well-developed.     Comments: Very pleasant talkative female.  HENT:     Head: Normocephalic and atraumatic.     Right Ear: Tympanic membrane, ear canal and external ear normal. No drainage, swelling or tenderness. Tympanic membrane is not injected, scarred, erythematous, retracted or bulging.     Left Ear: Tympanic membrane, ear canal and external ear normal. No drainage, swelling or tenderness. Tympanic membrane is not injected,  scarred, erythematous, retracted or bulging.     Nose: No nasal deformity, septal deviation, mucosal edema or rhinorrhea.     Right Turbinates: Enlarged and swollen.     Left Turbinates: Enlarged  and swollen.     Right Sinus: No maxillary sinus tenderness or frontal sinus tenderness.     Left Sinus: No maxillary sinus tenderness or frontal sinus tenderness.     Mouth/Throat:     Mouth: Mucous membranes are not pale and not dry.     Pharynx: Uvula midline.  Eyes:     General:        Right eye: No discharge.        Left eye: No discharge.     Conjunctiva/sclera: Conjunctivae normal.     Right eye: Right conjunctiva is not injected. No chemosis.    Left eye: Left conjunctiva is not injected. No chemosis.    Pupils: Pupils are equal, round, and reactive to light.  Cardiovascular:     Rate and Rhythm: Normal rate and regular rhythm.     Heart sounds: Normal heart sounds.  Pulmonary:     Effort: Pulmonary effort is normal. No tachypnea, accessory muscle usage or respiratory distress.     Breath sounds: Normal breath sounds. No wheezing, rhonchi or rales.     Comments: Moving air well in all lung fields.  No increased work of breathing. Chest:     Chest wall: No tenderness.  Abdominal:     Tenderness: There is no abdominal tenderness. There is no guarding or rebound.  Lymphadenopathy:     Head:     Right side of head: No submandibular, tonsillar or occipital adenopathy.     Left side of head: No submandibular, tonsillar or occipital adenopathy.     Cervical: No cervical adenopathy.  Skin:    General: Skin is warm.     Capillary Refill: Capillary refill takes less than 2 seconds.     Coloration: Skin is not pale.     Findings: No abrasion, erythema, petechiae or rash. Rash is not papular, urticarial or vesicular.     Comments: Very dermatographic skin.  Fair skinned overall.  Neurological:     Mental Status: She is alert.  Psychiatric:        Behavior: Behavior is cooperative.      Diagnostic studies:    Spirometry: results normal (FEV1: 2.98/91%, FVC: 3.79/95%, FEV1/FVC: 79%).    Spirometry consistent with normal pattern.   Allergy Studies:     Airborne Adult Perc - 02/02/21 0941     Time Antigen Placed 0930    Allergen Manufacturer Lavella Hammock    Location Back    Number of Test 59    1. Control-Buffer 50% Glycerol Negative    2. Control-Histamine 1 mg/ml 2+    3. Albumin saline Negative    4. [place] 3+    5. [place] 3+    6. [place] 3+    7. [place] Blue 3+    8. Meadow Fescue 3+    9. Perennial Rye Negative    10. Sweet Vernal 3+    11. Timothy 4+    12. Cocklebur 2+    13. Burweed Marshelder 2+    14. Ragweed, short Negative    15. Ragweed, Giant Negative    16. Plantain,  English 2+    17. Lamb's Quarters Negative    18. Sheep Sorrell Negative    19. Rough Pigweed Negative    20. Marsh Elder, Rough Negative    21. Mugwort, Common Negative    22. Ash mix 2+    23. Birch mix Negative    24. Beech American Negative    25. Box, Elder 2+  [place], red 3+    27. Cottonwood, Eastern 2+    28. Elm mix Negative    29. Hickory 3+    30. Maple mix Negative    31. Oak, [place] mix Negative    32. Pecan Pollen 3+    33. Pine mix Negative    34. Sycamore Eastern Negative    35. Laverne, Black Pollen Negative    36. Alternaria alternata 2+    37. Cladosporium Herbarum 2+    38. Aspergillus mix Negative    39. Penicillium mix Negative    40. Bipolaris sorokiniana (Helminthosporium) 2+    41. Drechslera spicifera (Curvularia) Negative    42. Mucor plumbeus Negative    43. Fusarium moniliforme Negative    44. Aureobasidium pullulans (pullulara) Negative    45. Rhizopus oryzae 2+    46. Botrytis cinera Negative    47. Epicoccum nigrum Negative    48. Phoma betae Negative    49. Candida Albicans 2+    50. Trichophyton mentagrophytes Negative    51. Mite, D Farinae  5,000 AU/ml 3+    52. Mite, D Pteronyssinus  5,000 AU/ml 3+    53. Cat Hair  10,000 BAU/ml 3+    54.  Dog Epithelia Negative    55. Mixed Feathers Negative    56. Horse Epithelia Negative    57. Cockroach, German 2+    58. Mouse 2+    59. Tobacco Leaf Negative             Allergy testing results were read and interpreted by myself, documented by clinical staff.         Salvatore Marvel, MD [facility] [place] [facility] [place]

## 2021-02-02 NOTE — Patient Instructions (Addendum)
1. Chronic urticaria - You have several reasons for hives and sensitive skin on your testing, so we are going to aggressively address your allergic rhinitis to see if this helps at all. - Thankfully, there are no red flags for serious causes of hives and swelling. - I think we can hold off on extensive blood work at this time, but we may consider it in the future. - In the meantime, stop the Claritin (loratadine). - Start Zyrtec (cetirizine) 10 mg plus Pepcid (famotidine) 20mg  TWICE DAILY to stay ahead of the itching/hives. - Zyrtec can RARELY cause sedation, so they are aware of that. - MOST people tolerated just fine.  2. Seasonal and perennial allergic rhinitis - Testing today showed: grasses, weeds, trees, indoor molds, outdoor molds, dust mites, cat, and cockroach - Copy of test results provided.  - Avoidance measures provided. - Stop taking: Claritin (loratadine)  - Start taking: antihistamines as above - You can use an extra dose of the antihistamine, if needed, for breakthrough symptoms.  - Consider nasal saline rinses 1-2 times daily to remove allergens from the nasal cavities as well as help with mucous clearance (this is especially helpful to do before the nasal sprays are given) - Consider allergy shots as a means of long-term control. - Allergy shots "re-train" and "reset" the immune system to ignore environmental allergens and decrease the resulting immune response to those allergens (sneezing, itchy watery eyes, runny nose, nasal congestion, etc).    - Allergy shots improve symptoms in 75-85% of patients.  - We can discuss more at the next appointment if the medications are not working for you. - If the environmental allergies are contributing to your itching, which we will know by the next visit, allergy shots could help with the itching/hives.  3.  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 - I do not think there is a need for controller medication.  4.  Return in about 6 weeks (around 03/16/2021).    Please inform us of any Emergency Department visits, hospitalizations, or changes in symptoms. Call us before going to the ED for breathing or allergy symptoms since we might be able to fit you in for a sick visit. Feel free to contact us anytime with any questions, problems, or concerns.  It was a pleasure to meet you today!  Websites that have reliable patient information: 1. American Academy of Asthma, Allergy, and Immunology: www.aaaai.org 2. Food Allergy Research and Education (FARE): foodallergy.org 3. Mothers of Asthmatics: http://www.asthmacommunitynetwork.org 4. American College of Allergy, Asthma, and Immunology: www.acaai.org   COVID-19 Vaccine Information can be found at: [email or web address] For questions related to vaccine distribution or appointments, please email [email or web address] or call [phone].   We realize that you might be concerned about having an allergic reaction to the COVID19 vaccines. To help with that concern, WE ARE OFFERING THE COVID19 VACCINES IN OUR OFFICE! Ask the front desk for dates!     "Like" [place] on Facebook and Instagram for our latest updates!      A healthy democracy works best when [employer] participate! Make sure you are registered to vote! If you have moved or changed any of your contact information, you will need to get this updated before voting!  In some cases, you MAY be able to register to vote online: [email or web address]    1. Control-Buffer 50% Glycerol Negative   2. Control-Histamine 1 mg/ml 2+   3. Albumin saline Negative   4. [place]bach 3+   5. [place] 3+  6. Johnson 3+   7. [place] Blue 3+   8. Meadow Fescue 3+   9. Perennial Rye Negative   10. Sweet Vernal 3+   11. Timothy 4+   12. Cocklebur 2+   13. Burweed Marshelder 2+   14. Ragweed, short Negative   15. Ragweed, Giant Negative    16. Plantain,  English 2+   17. Lamb's Quarters Negative   18. Sheep Sorrell Negative   19. Rough Pigweed Negative   20. Marsh Elder, Rough Negative   21. Mugwort, Common Negative   22. Ash mix 2+   23. Birch mix Negative   24. Beech American Negative   25. Box, Elder 2+   26. Cedar, red 3+   27. Cottonwood, Eastern 2+   28. Elm mix Negative   29. Hickory 3+   30. Maple mix Negative   31. Oak, [place] mix Negative   32. Pecan Pollen 3+   33. Pine mix Negative   34. Sycamore Eastern Negative   35. Tavistock, Black Pollen Negative   36. Alternaria alternata 2+   37. Cladosporium Herbarum 2+   38. Aspergillus mix Negative   39. Penicillium mix Negative   40. Bipolaris sorokiniana (Helminthosporium) 2+   41. Drechslera spicifera (Curvularia) Negative   42. Mucor plumbeus Negative   43. Fusarium moniliforme Negative   44. Aureobasidium pullulans (pullulara) Negative   45. Rhizopus oryzae 2+   46. Botrytis cinera Negative   47. Epicoccum nigrum Negative   48. Phoma betae Negative   49. Candida Albicans 2+   50. Trichophyton mentagrophytes Negative   51. Mite, D Farinae  5,000 AU/ml 3+   52. Mite, D Pteronyssinus  5,000 AU/ml 3+   53. Cat Hair 10,000 BAU/ml 3+   54.  Dog Epithelia Negative   55. Mixed Feathers Negative   56. Horse Epithelia Negative   57. Cockroach, German 2+   58. Mouse 2+   59. Tobacco Leaf Negative     Reducing Pollen Exposure  The American Academy of Allergy, Asthma and Immunology suggests the following steps to reduce your exposure to pollen during allergy seasons.    Do not hang sheets or clothing out to dry; pollen may collect on these items. Do not mow lawns or spend time around freshly cut grass; mowing stirs up pollen. Keep windows closed at night.  Keep car windows closed while driving. Minimize morning activities outdoors, a time when pollen counts are usually at their highest. Stay indoors as much as possible when pollen counts or humidity  is high and on windy days when pollen tends to remain in the air longer. Use air conditioning when possible.  Many air conditioners have filters that trap the pollen spores. Use a HEPA room air filter to remove pollen form the indoor air you breathe.  Control of Mold Allergen   Mold and fungi can grow on a variety of surfaces provided certain temperature and moisture conditions exist.  Outdoor molds grow on plants, decaying vegetation and soil.  The major outdoor mold, Alternaria and Cladosporium, are found in very high numbers during hot and dry conditions.  Generally, a late Summer - Fall peak is seen for common outdoor fungal spores.  Rain will temporarily lower outdoor mold spore count, but counts rise rapidly when the rainy period ends.  The most important indoor molds are Aspergillus and Penicillium.  Dark, humid and poorly ventilated basements are ideal sites for mold growth.  The next most common sites of mold growth  are the bathroom and the kitchen.  Outdoor (Seasonal) Mold Control  Positive outdoor molds via skin testing: Alternaria, Cladosporium, and Bipolaris (Helminthsporium)  Use air conditioning and keep windows closed Avoid exposure to decaying vegetation. Avoid leaf raking. Avoid grain handling. Consider wearing a face mask if working in moldy areas.    Indoor (Perennial) Mold Control   Positive indoor molds via skin testing: Rhizopus and Candida  Maintain humidity below 50%. Clean washable surfaces with 5% bleach solution. Remove sources e.g. contaminated carpets.     Control of Dog or Cat Allergen  Avoidance is the best way to manage a dog or cat allergy. If you have a dog or cat and are allergic to dog or cats, consider removing the dog or cat from the home. If you have a dog or cat but don't want to find it a new home, or if your family wants a pet even though someone in the household is allergic, here are some strategies that may help keep symptoms at  bay:  Keep the pet out of your bedroom and restrict it to only a few rooms. Be advised that keeping the dog or cat in only one room will not limit the allergens to that room. Don't pet, hug or kiss the dog or cat; if you do, wash your hands with soap and water. High-efficiency particulate air (HEPA) cleaners run continuously in a bedroom or living room can reduce allergen levels over time. Regular use of a high-efficiency vacuum cleaner or a central vacuum can reduce allergen levels. Giving your dog or cat a bath at least once a week can reduce airborne allergen.  Control of Cockroach Allergen  Cockroach allergen has been identified as an important cause of acute attacks of asthma, especially in urban settings.  There are fifty-five species of cockroach that exist in the [place], however only three, the [place], [place] species produce allergen that can affect patients with Asthma.  Allergens can be obtained from fecal particles, egg casings and secretions from cockroaches.    Remove food sources. Reduce access to water. Seal access and entry points. Spray runways with 0.5-1% Diazinon or Chlorpyrifos Blow boric acid power under stoves and refrigerator. Place bait stations (hydramethylnon) at feeding sites.  Allergy Shots   Allergies are the result of a chain reaction that starts in the immune system. Your immune system controls how your body defends itself. For instance, if you have an allergy to pollen, your immune system identifies pollen as an invader or allergen. Your immune system overreacts by producing antibodies called Immunoglobulin E (IgE). These antibodies travel to cells that release chemicals, causing an allergic reaction.  The concept behind allergy immunotherapy, whether it is received in the form of shots or tablets, is that the immune system can be desensitized to specific allergens that trigger allergy symptoms. Although it requires time and patience, the  payback can be long-term relief.  How Do Allergy Shots Work?  Allergy shots work much like a vaccine. Your body responds to injected amounts of a particular allergen given in increasing doses, eventually developing a resistance and tolerance to it. Allergy shots can lead to decreased, minimal or no allergy symptoms.  There generally are two phases: build-up and maintenance. Build-up often ranges from three to six months and involves receiving injections with increasing amounts of the allergens. The shots are typically given once or twice a week, though more rapid build-up schedules are sometimes used.  The maintenance phase begins when the  most effective dose is reached. This dose is different for each person, depending on how allergic you are and your response to the build-up injections. Once the maintenance dose is reached, there are longer periods between injections, typically two to four weeks.  Occasionally doctors give cortisone-type shots that can temporarily reduce allergy symptoms. These types of shots are different and should not be confused with allergy immunotherapy shots.  Who Can Be Treated with Allergy Shots?  Allergy shots may be a good treatment approach for people with allergic rhinitis (hay fever), allergic asthma, conjunctivitis (eye allergy) or stinging insect allergy.   Before deciding to begin allergy shots, you should consider:   The length of allergy season and the severity of your symptoms  Whether medications and/or changes to your environment can control your symptoms  Your desire to avoid long-term medication use  Time: allergy immunotherapy requires a major time commitment  Cost: may vary depending on your insurance coverage  Allergy shots for children age 20 and older are effective and often well tolerated. They might prevent the onset of new allergen sensitivities or the progression to asthma.  Allergy shots are not started on patients who are pregnant but  can be continued on patients who become pregnant while receiving them. In some patients with other medical conditions or who take certain common medications, allergy shots may be of risk. It is important to mention other medications you talk to your allergist.   When Will I Feel Better?  Some may experience decreased allergy symptoms during the build-up phase. For others, it may take as long as 12 months on the maintenance dose. If there is no improvement after a year of maintenance, your allergist will discuss other treatment options with you.  If you aren't responding to allergy shots, it may be because there is not enough dose of the allergen in your vaccine or there are missing allergens that were not identified during your allergy testing. Other reasons could be that there are high levels of the allergen in your environment or major exposure to non-allergic triggers like tobacco smoke.  What Is the Length of Treatment?  Once the maintenance dose is reached, allergy shots are generally continued for three to five years. The decision to stop should be discussed with your allergist at that time. Some people may experience a permanent reduction of allergy symptoms. Others may relapse and a longer course of allergy shots can be considered.  What Are the Possible Reactions?  The two types of adverse reactions that can occur with allergy shots are local and systemic. Common local reactions include very mild redness and swelling at the injection site, which can happen immediately or several hours after. A systemic reaction, which is less common, affects the entire body or a particular body system. They are usually mild and typically respond quickly to medications. Signs include increased allergy symptoms such as sneezing, a stuffy nose or hives.  Rarely, a serious systemic reaction called anaphylaxis can develop. Symptoms include swelling in the throat, wheezing, a feeling of tightness in the chest,  nausea or dizziness. Most serious systemic reactions develop within 30 minutes of allergy shots. This is why it is strongly recommended you wait in your doctor's office for 30 minutes after your injections. Your allergist is trained to watch for reactions, and his or her staff is trained and equipped with the proper medications to identify and treat them.  Who Should Administer Allergy Shots?  The preferred location for receiving shots is  your prescribing allergist's office. Injections can sometimes be given at another facility where the physician and staff are trained to recognize and treat reactions, and have received instructions by your prescribing allergist.

## 2021-03-23 ENCOUNTER — Ambulatory Visit: Payer: No Typology Code available for payment source | Admitting: Family Medicine

## 2021-03-23 ENCOUNTER — Ambulatory Visit: Payer: No Typology Code available for payment source | Admitting: Family

## 2021-04-12 NOTE — Patient Instructions (Addendum)
1. Chronic urticaria. - Increase Zyrtec (cetirizine) 10 mg to twice a day -Increase Pepcid (famotidine) '20mg'$  twice a day -We will get lab work to look for causes of your hives. We will call you once they are all back.  2. Seasonal and perennial allergic rhinitis (grass, weeds, trees, molds, dust mites, cat, and cockroach) - Continue taking: antihistamines as above -Start fluticasone nasal spray 1 to 2 sprays each nostril once a day as needed for stuffy nose. In the right nostril, point the applicator out toward the right ear. In the left nostril, point the applicator out toward the left ear -Start azelastine nasal spray using 1 to 2 sprays each nostril twice a day as needed for runny nose/drainage down the throat - Consider nasal saline rinses 1-2 times daily to remove allergens from the nasal cavities as well as help with mucous clearance (this is especially helpful to do before the nasal sprays are given) - Consider allergy shots as a means of long-term control. - Allergy shots "re-train" and "reset" the immune system to ignore environmental allergens and decrease the resulting immune response to those allergens (sneezing, itchy watery eyes, runny nose, nasal congestion, etc).    - Allergy shots improve symptoms in 75-85% of patients.  - We can discuss more at the next appointment if the medications are not working for you. - If the environmental allergies are contributing to your itching, which we will know by the next visit, allergy shots could help with the itching/hives.  3.  Exercise-induced bronchospasm - Continue albuterol as needed -Consider Singulair (montelukast) in the future if needed  Let us know if you do start to try to get pregnant  Please let us know if this treatment plan is not working well for you. Schedule a follow up appointment in 4-6 weeks or sooner if needed

## 2021-04-13 ENCOUNTER — Ambulatory Visit: Payer: No Typology Code available for payment source | Admitting: Family

## 2021-04-13 ENCOUNTER — Encounter: Payer: Self-pay | Admitting: Family

## 2021-04-13 ENCOUNTER — Other Ambulatory Visit: Payer: Self-pay

## 2021-04-13 VITALS — BP 96/60 | HR 70 | Temp 98.2°F | Resp 16

## 2021-04-13 DIAGNOSIS — L508 Other urticaria: Secondary | ICD-10-CM

## 2021-04-13 DIAGNOSIS — J302 Other seasonal allergic rhinitis: Secondary | ICD-10-CM | POA: Diagnosis not present

## 2021-04-13 DIAGNOSIS — J45990 Exercise induced bronchospasm: Secondary | ICD-10-CM

## 2021-04-13 DIAGNOSIS — J3089 Other allergic rhinitis: Secondary | ICD-10-CM | POA: Diagnosis not present

## 2021-04-13 MED ORDER — FLUTICASONE PROPIONATE 50 MCG/ACT NA SUSP: NASAL | 3 refills | Status: DC

## 2021-04-13 MED ORDER — AZELASTINE HCL 0.1 % NA SOLN: NASAL | 3 refills | Status: DC

## 2021-04-13 MED ORDER — ALBUTEROL SULFATE HFA 108 (90 BASE) MCG/ACT IN AERS: 2.0000 | INHALATION_SPRAY | RESPIRATORY_TRACT | 1 refills | Status: AC | PRN

## 2021-04-13 MED ORDER — FAMOTIDINE 20 MG PO TABS: ORAL_TABLET | ORAL | 0 refills | Status: DC

## 2021-04-13 NOTE — Progress Notes (Signed)
[address] [place] [address] Dept: [phone]  FOLLOW UP NOTE  Patient ID: Gina Butler, female    DOB: [birth year]  Age: 40 y.o. MRN: [id number] Date of Office Visit: 04/13/2021  Assessment  Chief Complaint: Allergic Rhinitis  and Asthma  HPI Gina Butler is a 40 year old female who presents today for follow-up of chronic urticaria, seasonal and perennial allergic rhinitis, and exercise-induced bronchospasm.  She was last seen on February 02, 2021 by Dr. Ernst Bowler.  Since her last office visit she was diagnosed with COVID-19 around August 21 of this year.  Chronic urticaria is reported as doing a lot better with cetirizine 10 mg once a day and famotidine 20 mg once a day.  She has noticed if she takes her Zyrtec and Pepcid later in the evening rather than in the morning like she normally does that she will get hives.  She also has noticed that for the past 2 to 3 weeks she will get spots on her legs after getting out of the shower.  These are more like spots and not as concentrated as the hives were earlier.  The spots are itchy and will last for about 45 minutes then go away.  She uses Free and clear detergent and sensitive skin/unscented soap.  She denies any concomitant cardiorespiratory and gastrointestinal symptoms.  She ask if Zyrtec and Pepcid are safe to take if she becomes pregnant.  She reports that she is currently not trying.  Seasonal and perennial allergic rhinitis is reported as not well controlled with Zyrtec 10 mg once a day.  She reports since having COVID-19 she has had a lot of nasal congestion and postnasal drip.  She currently does not use any nose sprays.  She denies rhinorrhea.  She has not had any sinus infections since we last saw her.  Exercise-induced bronchospasm is reported as moderately controlled with ProAir Respiclick.  She reports a dry cough and shortness of breath with exertion such as cycling or being on the elliptical.  She denies any wheezing,  tightness in her chest, or nocturnal awakenings due to breathing problems.  Since her last office visit she has not required any systemic steroids or made any trips to the emergency room or urgent care due to breathing problems.  She has used her albuterol inhaler 2 times since her last office visit and she is not sure if it made a difference.  She reports that she is only exercising now once a week since [facility] is back in session.   Drug Allergies:  Allergies  Allergen Reactions   Compazine [Prochlorperazine Edisylate] Other (See Comments)    "Affected the muscles in my neck, could not move my head."   Sulfa Antibiotics     Childhood reaction    Review of Systems: Review of Systems  Constitutional:  Negative for chills and fever.  HENT:         Reports nasal congestion and postnasal drip.  Denies rhinorrhea.  Eyes:        Reports a little bit of watery eyes and denies itchy eyes  Respiratory:  Positive for cough and shortness of breath. Negative for wheezing.        Reports a dry cough and shortness of breath with exertion.  Denies wheezing, tightness in her chest, and nocturnal awakenings due to breathing problems.  Cardiovascular:  Negative for chest pain and palpitations.  Gastrointestinal:        Denies heartburn and reflux symptoms  Genitourinary:  Negative for frequency.  Skin:  Positive for itching and rash.  Neurological:  Negative for headaches.  Endo/Heme/Allergies:  Positive for environmental allergies.    Physical Exam: BP 96/60 (BP Location: Right Arm, Patient Position: Sitting, Cuff Size: Normal)   Pulse 70   Temp 98.2 F (36.8 C) (Temporal)   Resp 16   SpO2 99%    Physical Exam Constitutional:      Appearance: Normal appearance.  HENT:     Head: Normocephalic and atraumatic.     Comments: Pharynx normal, eyes normal, ears normal, nose: Bilateral lower turbinates moderately edematous and slightly erythematous with no drainage noted    Right  Ear: Tympanic membrane, ear canal and external ear normal.     Left Ear: Tympanic membrane, ear canal and external ear normal.     Mouth/Throat:     Mouth: Mucous membranes are moist.     Pharynx: Oropharynx is clear.  Eyes:     Conjunctiva/sclera: Conjunctivae normal.  Cardiovascular:     Rate and Rhythm: Regular rhythm.     Heart sounds: Normal heart sounds.  Pulmonary:     Effort: Pulmonary effort is normal.     Breath sounds: Normal breath sounds.     Comments: Lungs clear to auscultation Musculoskeletal:     Cervical back: Neck supple.  Skin:    General: Skin is warm.     Comments: No rash or urticarial lesions noted  Neurological:     Mental Status: She is alert and oriented to person, place, and time.  Psychiatric:        Mood and Affect: Mood normal.        Behavior: Behavior normal.        Thought Content: Thought content normal.        Judgment: Judgment normal.    Diagnostics:  FVC 3.77 L, FEV1 2.97 L.  Predicted FVC 3.98 L, predicted FEV1 3.27 L.  Spirometry indicates normal respiratory function.  Assessment and Plan: 1. Chronic urticaria   2. Seasonal and perennial allergic rhinitis   3. Exercise induced bronchospasm     Meds ordered this encounter  Medications   fluticasone (FLONASE) 50 MCG/ACT nasal spray    Sig: Use 1 to 2 sprays each nostril once a day as needed for stuffy nose    Dispense:  16 g    Refill:  3   azelastine (ASTELIN) 0.1 % nasal spray    Sig: Use 1 to 2 sprays each nostril twice a day as needed for runny nose/drainage down the throat    Dispense:  30 mL    Refill:  3   albuterol (PROVENTIL HFA) 108 (90 Base) MCG/ACT inhaler    Sig: Inhale 2 puffs into the lungs every 4 (four) hours as needed for wheezing or shortness of breath.    Dispense:  1 each    Refill:  1   famotidine (PEPCID) 20 MG tablet    Sig: Take one tablet twice daily for hives    Dispense:  180 tablet    Refill:  0     Patient Instructions  1. Chronic  urticaria. - Increase Zyrtec (cetirizine) 10 mg to twice a day -Increase Pepcid (famotidine) '20mg'$  twice a day -We will get lab work to look for causes of your hives. We will call you once they are all back.  2. Seasonal and perennial allergic rhinitis (grass, weeds, trees, molds, dust mites, cat, and cockroach) - Continue taking: antihistamines as above -Start  fluticasone nasal spray 1 to 2 sprays each nostril once a day as needed for stuffy nose. In the right nostril, point the applicator out toward the right ear. In the left nostril, point the applicator out toward the left ear -Start azelastine nasal spray using 1 to 2 sprays each nostril twice a day as needed for runny nose/drainage down the throat - Consider nasal saline rinses 1-2 times daily to remove allergens from the nasal cavities as well as help with mucous clearance (this is especially helpful to do before the nasal sprays are given) - Consider allergy shots as a means of long-term control. - Allergy shots "re-train" and "reset" the immune system to ignore environmental allergens and decrease the resulting immune response to those allergens (sneezing, itchy watery eyes, runny nose, nasal congestion, etc).    - Allergy shots improve symptoms in 75-85% of patients.  - We can discuss more at the next appointment if the medications are not working for you. - If the environmental allergies are contributing to your itching, which we will know by the next visit, allergy shots could help with the itching/hives.  3.  Exercise-induced bronchospasm - Continue albuterol as needed -Consider Singulair (montelukast) in the future if needed  Let us know if you do start to try to get pregnant  Please let us know if this treatment plan is not working well for you. Schedule a follow up appointment in 4-6 weeks or sooner if needed Return in about 4 weeks (around 05/11/2021), or if symptoms worsen or fail to improve.    Thank you for the  opportunity to care for this patient.  Please do not hesitate to contact me with questions.  Althea Charon, FNP [facility] [place] [facility] [place]

## 2021-05-23 NOTE — Patient Instructions (Addendum)
1. Chronic urticaria. - Continue Zyrtec (cetirizine) 10 mg once a day. May increase to twice a day if needed - Continue Pepcid (famotidine) 20mg  twice a day - We do not have all your lab results back yet. We will call you once they are all back.  2. Seasonal and perennial allergic rhinitis (grass, weeds, trees, molds, dust mites, cat, and cockroach) - Continue taking: antihistamines as above -Continue fluticasone nasal spray 1 to 2 sprays each nostril once a day as needed for stuffy nose.  -Continue azelastine nasal spray using 1 to 2 sprays each nostril twice a day as needed for runny nose/drainage down the throat - Consider nasal saline rinses 1-2 times daily to remove allergens from the nasal cavities as well as help with mucous clearance (this is especially helpful to do before the nasal sprays are given) - Consider allergy shots as a means of long-term control. - Allergy shots "re-train" and "reset" the immune system to ignore environmental allergens and decrease the resulting immune response to those allergens (sneezing, itchy watery eyes, runny nose, nasal congestion, etc).    - Allergy shots improve symptoms in 75-85% of patients.  - We can discuss more at the next appointment if the medications are not working for you. - If the environmental allergies are contributing to your itching, which we will know by the next visit, allergy shots could help with the itching/hives.  3.  Exercise-induced bronchospasm - Continue albuterol as needed  Let us know if you do start to try to get pregnant  Please let us know if this treatment plan is not working well for you. Schedule a follow up appointment in 3 months or sooner if needed

## 2021-05-25 ENCOUNTER — Encounter: Payer: Self-pay | Admitting: Family

## 2021-05-25 ENCOUNTER — Ambulatory Visit (INDEPENDENT_AMBULATORY_CARE_PROVIDER_SITE_OTHER): Payer: No Typology Code available for payment source | Admitting: Family

## 2021-05-25 ENCOUNTER — Other Ambulatory Visit: Payer: Self-pay

## 2021-05-25 VITALS — BP 94/70 | HR 64 | Temp 97.5°F | Resp 20

## 2021-05-25 DIAGNOSIS — J45990 Exercise induced bronchospasm: Secondary | ICD-10-CM

## 2021-05-25 DIAGNOSIS — J3089 Other allergic rhinitis: Secondary | ICD-10-CM | POA: Diagnosis not present

## 2021-05-25 DIAGNOSIS — L508 Other urticaria: Secondary | ICD-10-CM | POA: Diagnosis not present

## 2021-05-25 DIAGNOSIS — J302 Other seasonal allergic rhinitis: Secondary | ICD-10-CM | POA: Diagnosis not present

## 2021-05-25 NOTE — Progress Notes (Signed)
[place] [id number] Dept: [phone]  FOLLOW UP NOTE  Patient ID: Gina Butler, female    DOB: 06/13/1981  Age: 40 y.o. MRN: [id number] Date of Office Visit: 05/25/2021  Assessment  Chief Complaint: Allergic Rhinitis  and Asthma  HPI Gina Butler is a 40 year old female who presents today for follow-up of chronic urticaria, seasonal and perennial allergic rhinitis, and exercise-induced bronchospasm.  She was last seen on April 13, 2021 by Althea Charon, FNP.  Chronic urticaria is reported as doing better since her last office visit.  She is currently taking Zyrtec 10 mg once a day and Pepcid 20 mg twice a day.  She reports sporadic hives that will last for 10 to 15 minutes if she does not scratch them.If she does scratch the hives they will be gone in less than 24 hours.  She also mentions that if she misses a dose of one of her medicines that she will get itchy around the neck and inner thighs. She is concerned about becoming dependent on her medications.  Discussed with Bubba Hales that the lab work for hives that has come back so far is normal, but we would call her once all the results were back.  Seasonal and perennial allergic rhinitis is reported as moderately controlled with Zyrtec 10 mg once a day.  She has not been using fluticasone nasal spray or azelastine nasal spray.  She reports since the season change she has had clear rhinorrhea, nasal congestion, postnasal drip at night, and sore throat in the morning.  Exercise-induced bronchospasm is reported as controlled with albuterol as needed.  She reports since she had COVID-19 in August she has had a trailing cough that is getting better.  She reports occasional dry cough and denies wheezing, tightness in her chest, shortness of breath, and nocturnal awakenings due to breathing problems.  She has not had to use her albuterol inhaler since we last saw her.  She denies any trips to the emergency room or urgent  care due to breathing problems and has not required any systemic steroids since we last saw her.   Drug Allergies:  Allergies  Allergen Reactions   Compazine [Prochlorperazine Edisylate] Other (See Comments)    "Affected the muscles in my neck, could not move my head."   Sulfa Antibiotics     Childhood reaction    Review of Systems: Review of Systems  Constitutional:  Negative for chills and fever.  HENT:         Reports clear rhinorrhea, nasal congestion, post nasal drip at night, and sore throat in the morning since season change  Eyes:        Denies itchy watery eyes  Respiratory:  Positive for cough. Negative for shortness of breath and wheezing.        Reports lingering dry cough that is getting better since having Covid-19 in August. Denies wheezing, tightness in chest, or shortness of breath  Cardiovascular:  Negative for chest pain and palpitations.  Gastrointestinal:        Denies heartburn and reflux symptoms  Genitourinary:  Negative for frequency.  Skin:  Positive for itching and rash.       Reports sporadic urticarial lesions and itching if she misses her medication  Neurological:  Negative for headaches.  Endo/Heme/Allergies:  Positive for environmental allergies.    Physical Exam: BP 94/70 (BP Location: Right Arm, Patient Position: Sitting, Cuff Size: Normal)   Pulse 64   Temp Marland Kitchen)  97.5 F (36.4 C) (Temporal)   Resp 20   SpO2 99%    Physical Exam Constitutional:      Appearance: Normal appearance.  HENT:     Head: Normocephalic and atraumatic.     Comments: Pharynx normal. Eyes normal. Ears normal. Nose normal    Right Ear: Tympanic membrane, ear canal and external ear normal.     Left Ear: Tympanic membrane, ear canal and external ear normal.     Nose: Nose normal.     Mouth/Throat:     Mouth: Mucous membranes are moist.     Pharynx: Oropharynx is clear.  Eyes:     Conjunctiva/sclera: Conjunctivae normal.  Cardiovascular:     Rate and Rhythm:  Regular rhythm.     Heart sounds: Normal heart sounds.  Pulmonary:     Effort: Pulmonary effort is normal.     Breath sounds: Normal breath sounds.     Comments: Lungs clear to auscultation Musculoskeletal:     Cervical back: Neck supple.  Skin:    General: Skin is warm.     Comments: No rashes or urticarial lesions noted  Neurological:     Mental Status: She is alert and oriented to person, place, and time.  Psychiatric:        Mood and Affect: Mood normal.        Behavior: Behavior normal.        Thought Content: Thought content normal.        Judgment: Judgment normal.    Diagnostics: FVC 3.75 L, FEV1 2.90 L.  Predicted FVC 3.98 L, predicted FEV1 3.27 L.  Spirometry indicates normal respiratory function.  Assessment and Plan: 1. Exercise induced bronchospasm   2. Chronic urticaria   3. Seasonal and perennial allergic rhinitis     No orders of the defined types were placed in this encounter.   Patient Instructions  1. Chronic urticaria. - Continue Zyrtec (cetirizine) 10 mg once a day. May increase to twice a day if needed - Continue Pepcid (famotidine) 20mg  twice a day - We do not have all your lab results back yet. We will call you once they are all back.  2. Seasonal and perennial allergic rhinitis (grass, weeds, trees, molds, dust mites, cat, and cockroach) - Continue taking: antihistamines as above -Continue fluticasone nasal spray 1 to 2 sprays each nostril once a day as needed for stuffy nose.  -Continue azelastine nasal spray using 1 to 2 sprays each nostril twice a day as needed for runny nose/drainage down the throat - Consider nasal saline rinses 1-2 times daily to remove allergens from the nasal cavities as well as help with mucous clearance (this is especially helpful to do before the nasal sprays are given) - Consider allergy shots as a means of long-term control. - Allergy shots "re-train" and "reset" the immune system to ignore environmental allergens and  decrease the resulting immune response to those allergens (sneezing, itchy watery eyes, runny nose, nasal congestion, etc).    - Allergy shots improve symptoms in 75-85% of patients.  - We can discuss more at the next appointment if the medications are not working for you. - If the environmental allergies are contributing to your itching, which we will know by the next visit, allergy shots could help with the itching/hives.  3.  Exercise-induced bronchospasm - Continue albuterol as needed  Let us know if you do start to try to get pregnant  Please let us know if this treatment plan is not  working well for you. Schedule a follow up appointment in 3 months or sooner if needed Return in about 3 months (around 08/25/2021), or if symptoms worsen or fail to improve.    Thank you for the opportunity to care for this patient.  Please do not hesitate to contact me with questions.  Althea Charon, FNP [facility] [place] [facility]

## 2021-05-31 LAB — CBC WITH DIFFERENTIAL
Basophils Absolute: 0 10*3/uL (ref 0.0–0.2)
Basos: 1 %
EOS (ABSOLUTE): 0.1 10*3/uL (ref 0.0–0.4)
Eos: 2 %
Hematocrit: 43.2 % (ref 34.0–46.6)
Hemoglobin: 14 g/dL (ref 11.1–15.9)
Immature Grans (Abs): 0 10*3/uL (ref 0.0–0.1)
Immature Granulocytes: 0 %
Lymphocytes Absolute: 2 10*3/uL (ref 0.7–3.1)
Lymphs: 33 %
MCH: 28.7 pg (ref 26.6–33.0)
MCHC: 32.4 g/dL (ref 31.5–35.7)
MCV: 89 fL (ref 79–97)
Monocytes Absolute: 0.7 10*3/uL (ref 0.1–0.9)
Monocytes: 13 %
Neutrophils Absolute: 3 10*3/uL (ref 1.4–7.0)
Neutrophils: 51 %
RBC: 4.88 x10E6/uL (ref 3.77–5.28)
RDW: 12.8 % (ref 11.7–15.4)
WBC: 5.9 10*3/uL (ref 3.4–10.8)

## 2021-05-31 LAB — ALPHA-GAL PANEL
Allergen Lamb IgE: <0.1 kU/L
Beef IgE: <0.1 kU/L
IgE (Immunoglobulin E), Serum: 77 IU/mL (ref 6–495)
O215-IgE Alpha-Gal: <0.1 kU/L
Pork IgE: <0.1 kU/L

## 2021-05-31 LAB — COMPREHENSIVE METABOLIC PANEL
ALT: 14 IU/L (ref 0–32)
AST: 21 IU/L (ref 0–40)
Albumin/Globulin Ratio: 1.7 (ref 1.2–2.2)
Albumin: 4.5 g/dL (ref 3.8–4.8)
Alkaline Phosphatase: 67 IU/L (ref 44–121)
BUN/Creatinine Ratio: 10 (ref 9–23)
BUN: 8 mg/dL (ref 6–20)
Bilirubin Total: 0.6 mg/dL (ref 0.0–1.2)
CO2: 23 mmol/L (ref 20–29)
Calcium: 9.4 mg/dL (ref 8.7–10.2)
Chloride: 101 mmol/L (ref 96–106)
Creatinine, Ser: 0.78 mg/dL (ref 0.57–1.00)
Globulin, Total: 2.6 g/dL (ref 1.5–4.5)
Glucose: 83 mg/dL (ref 70–99)
Potassium: 4.2 mmol/L (ref 3.5–5.2)
Sodium: 140 mmol/L (ref 134–144)
Total Protein: 7.1 g/dL (ref 6.0–8.5)
eGFR: 100 mL/min/{1.73_m2} (ref >59)

## 2021-05-31 LAB — FANA STAINING PATTERNS: Speckled Pattern: 1:320 {titer} — ABNORMAL HIGH

## 2021-05-31 LAB — THYROGLOBULIN ANTIBODY: Thyroglobulin Antibody: <1 IU/mL (ref 0.0–0.9)

## 2021-05-31 LAB — C4 COMPLEMENT: Complement C4, Serum: 21 mg/dL (ref 12–38)

## 2021-05-31 LAB — THYROID PEROXIDASE ANTIBODY: Thyroperoxidase Ab SerPl-aCnc: 12 IU/mL (ref 0–34)

## 2021-05-31 LAB — ANTINUCLEAR ANTIBODIES, IFA: ANA Titer 1: POSITIVE — AB

## 2021-05-31 LAB — CHRONIC URTICARIA: cu index: <5.7 (ref <10)

## 2021-05-31 LAB — TRYPTASE: Tryptase: 5 ug/L (ref 2.2–13.2)

## 2021-05-31 LAB — SEDIMENTATION RATE: Sed Rate: 8 mm/hr (ref 0–32)

## 2021-05-31 NOTE — Progress Notes (Signed)
Please let Gina Butler know that we finally got all her lab results back. Her ANA came back positive. This could be the reason for her itching/hives. Please refer her to see Rheumatology. If she is willing to see a Rheumatologist in [place], I would recommend Dr. Benjamine Mola.   The remainder of her labs were normal.

## 2021-06-16 ENCOUNTER — Telehealth: Payer: Self-pay | Admitting: *Deleted

## 2021-06-16 NOTE — Telephone Encounter (Signed)
Please refer pt to rheumatology in [facility] Dr. Benjamine Mola for positive ANA.

## 2021-06-16 NOTE — Progress Notes (Signed)
Thank you :)

## 2021-08-28 NOTE — Patient Instructions (Addendum)
1. Chronic urticaria/rash (ANA positive, FANA staining patterns - speckled pattern (1:320)- recommended rheumatology referral. ESR normal, alpha gal normal, c4 normal, thyroid peroxidase antibody and thyroglobulin antibody normal, tryptase normal, chronic urticaria index normal, complete metabolic panel normal, and cbc with diff normal) - Continue Zyrtec (cetirizine) 10 mg once a day. May increase to twice a day if needed - Continue Pepcid (famotidine) 86m twice a day. - Recommend following up with Rheumatology. Let [facility] if you need another referral. Also, consider schedule an appointment with dermatology   2. Seasonal and perennial allergic rhinitis (grass, weeds, trees, molds, dust mites, cat, and cockroach) - Continue taking: antihistamines as above -Continue fluticasone nasal spray 1 to 2 sprays each nostril once a day as needed for stuffy nose.  -Continue azelastine nasal spray using 1 to 2 sprays each nostril twice a day as needed for runny nose/drainage down the throat  3.  Exercise-induced bronchospasm - Continue albuterol as needed   Please let [facility] if this treatment plan is not working well for you. Schedule a follow up appointment in 3 months or sooner if needed

## 2021-08-29 ENCOUNTER — Ambulatory Visit: Payer: No Typology Code available for payment source | Admitting: Family

## 2021-08-29 ENCOUNTER — Other Ambulatory Visit: Payer: Self-pay

## 2021-08-29 ENCOUNTER — Encounter: Payer: Self-pay | Admitting: Family

## 2021-08-29 VITALS — BP 118/80 | HR 77 | Temp 98.0°F | Resp 17 | Ht 66.0 in | Wt 128.6 lb

## 2021-08-29 DIAGNOSIS — J3089 Other allergic rhinitis: Secondary | ICD-10-CM

## 2021-08-29 DIAGNOSIS — R21 Rash and other nonspecific skin eruption: Secondary | ICD-10-CM

## 2021-08-29 DIAGNOSIS — L508 Other urticaria: Secondary | ICD-10-CM | POA: Diagnosis not present

## 2021-08-29 DIAGNOSIS — J45990 Exercise induced bronchospasm: Secondary | ICD-10-CM

## 2021-08-29 DIAGNOSIS — J302 Other seasonal allergic rhinitis: Secondary | ICD-10-CM

## 2021-08-29 NOTE — Progress Notes (Signed)
[place] [address] Dept: [phone]  FOLLOW UP NOTE  Patient ID: Gina Butler, female    DOB: 1980/11/08  Age: 41 y.o. MRN: [id number] Date of Office Visit: 08/29/2021  Assessment  Chief Complaint: Follow-up and Urticaria  HPI Gina YTUARTE is a 41 year old female who presents today for follow-up of exercise-induced bronchospasm, chronic urticaria, and seasonal and perennial allergic rhinitis.  She was last seen on May 25, 2021 by Althea Charon, FNP.  Since her last office visit she reports that she had Mohs surgery on the left side of her forehead near her hairline on July 18, 2021 for basal cell carcinoma.  Exercise induced bronchospasm is reported as controlled with albuterol as needed.  She denies any coughing, wheezing, tightness in chest, shortness of breath, and nocturnal awakenings due to breathing problems.  Since her last office visit she has not required any systemic steroids or made any trips to the emergency room or urgent care due to breathing problems.  She has not had to use her albuterol inhaler since we last saw her.  Chronic urticaria is reported as not well controlled with Zyrtec 10 mg once a day, CeraVe lotion as needed, and Pepcid 20 mg twice a day.  She reports that she continues to get areas  4-5 days a week that are irritated along her inner thighs or the seams of pants, and collar of her shirt.  The areas are not itchy unless she rubs against them or scratches at them.  She will then use CeraVe lotion and this helps.  She does feel like the presentation of this rash is different from when she first came here.  She feels like the areas on her legs are more spaced out an area around her neck are more diffuse.  She did not see rheumatology as was recommended due to her positive ANA.  She does mention that in the past she has seen a rheumatologist at [facility] and the rheumatoid factor was negative.  She was being treated for inflammatory arthritis  and was on methotrexate and hydroxychloroquine.  She was able to stop both of these medications but then had a flareup and had to get back on these medications.  She then was able to get off these medications and has not had issues since 2017.  The rheumatologist that she saw at [facility] is now out of network with her insurance.  She denies any joint pain or swelling.  Seasonal and perennial allergic rhinitis is reported as controlled with Zyrtec 10 mg once a day.  She has not really needed to use Flonase nasal spray or azelastine nasal spray.  She denies rhinorrhea, nasal congestion, and postnasal drip.  She has not had any sinus infections since we last saw her.   Drug Allergies:  Allergies  Allergen Reactions   Compazine [Prochlorperazine Edisylate] Other (See Comments)    "Affected the muscles in my neck, could not move my head."   Sulfa Antibiotics     Childhood reaction    Review of Systems: Review of Systems  Constitutional:  Negative for chills and fever.  HENT:         Denies rhinorrhea, nasal congestion, and postnasal drip  Eyes:        Denies itchy watery eyes  Respiratory:  Negative for cough, shortness of breath and wheezing.   Cardiovascular:  Negative for chest pain and palpitations.  Gastrointestinal:        Denies heartburn and  reflux symptoms  Genitourinary:  Negative for frequency.  Musculoskeletal:  Negative for joint pain.       Denies joint pain and swelling  Skin:  Positive for itching and rash.  Neurological:  Negative for headaches.  Endo/Heme/Allergies:  Positive for environmental allergies.    Physical Exam: BP 118/80    Pulse 77    Temp 98 F (36.7 C) (Temporal)    Resp 17    Ht 5' 6"  (1.676 m)    Wt 128 lb 9.6 oz (58.3 kg)    SpO2 100%    BMI 20.76 kg/m    Physical Exam Constitutional:      Appearance: Normal appearance.  HENT:     Head: Normocephalic and atraumatic.     Comments: Pharynx normal, eyes normal, ears normal, nose: Bilateral lower  turbinates mildly edematous and slightly erythematous with clear drainage noted    Right Ear: Tympanic membrane, ear canal and external ear normal.     Left Ear: Tympanic membrane, ear canal and external ear normal.     Mouth/Throat:     Mouth: Mucous membranes are moist.     Pharynx: Oropharynx is clear.  Eyes:     Conjunctiva/sclera: Conjunctivae normal.  Cardiovascular:     Rate and Rhythm: Normal rate and regular rhythm.     Heart sounds: Normal heart sounds.  Pulmonary:     Effort: Pulmonary effort is normal.     Breath sounds: Normal breath sounds.     Comments: Lungs clear to auscultation Musculoskeletal:     Cervical back: Neck supple.  Skin:    General: Skin is warm.     Comments: Small papular slightly erythematous lesions noted on left lower leg.  No urticarial lesions noted  Neurological:     Mental Status: She is alert and oriented to person, place, and time.  Psychiatric:        Mood and Affect: Mood normal.        Behavior: Behavior normal.        Thought Content: Thought content normal.        Judgment: Judgment normal.    Diagnostics:  Will get spirometry at your next office visit.  Assessment and Plan: 1. Chronic urticaria   2. Rash and nonspecific skin eruption   3. Exercise induced bronchospasm   4. Seasonal and perennial allergic rhinitis     No orders of the defined types were placed in this encounter.   Patient Instructions  1. Chronic urticaria/rash (ANA positive, FANA staining patterns - speckled pattern (1:320)- recommended rheumatology referral. ESR normal, alpha gal normal, c4 normal, thyroid peroxidase antibody and thyroglobulin antibody normal, tryptase normal, chronic urticaria index normal, complete metabolic panel normal, and cbc with diff normal) - Continue Zyrtec (cetirizine) 10 mg once a day. May increase to twice a day if needed - Continue Pepcid (famotidine) 33m twice a day. - Recommend following up with Rheumatology. Let [facility] if  you need another referral. Also, consider schedule an appointment with dermatology   2. Seasonal and perennial allergic rhinitis (grass, weeds, trees, molds, dust mites, cat, and cockroach) - Continue taking: antihistamines as above -Continue fluticasone nasal spray 1 to 2 sprays each nostril once a day as needed for stuffy nose.  -Continue azelastine nasal spray using 1 to 2 sprays each nostril twice a day as needed for runny nose/drainage down the throat  3.  Exercise-induced bronchospasm - Continue albuterol as needed   Please let [facility] if this treatment plan  is not working well for you. Schedule a follow up appointment in 3 months or sooner if needed  Return in about 3 months (around 11/26/2021), or if symptoms worsen or fail to improve.    Thank you for the opportunity to care for this patient.  Please do not hesitate to contact me with questions.  Althea Charon, FNP [facility] [place] [facility]

## 2021-10-12 IMAGING — US US BREAST*L* LIMITED INC AXILLA
1 series · 6 of 6 positions shown · non-contrast
Comparison: Previous exam(s).

CLINICAL DATA: Short-term follow-up for a probably benign left
breast mass.

EXAM:
ULTRASOUND OF THE LEFT BREAST

[Series 1: us breast*left* limited inc axilla · 0.06mm/px · 6 of 6 slices shown]
[im 1/6]
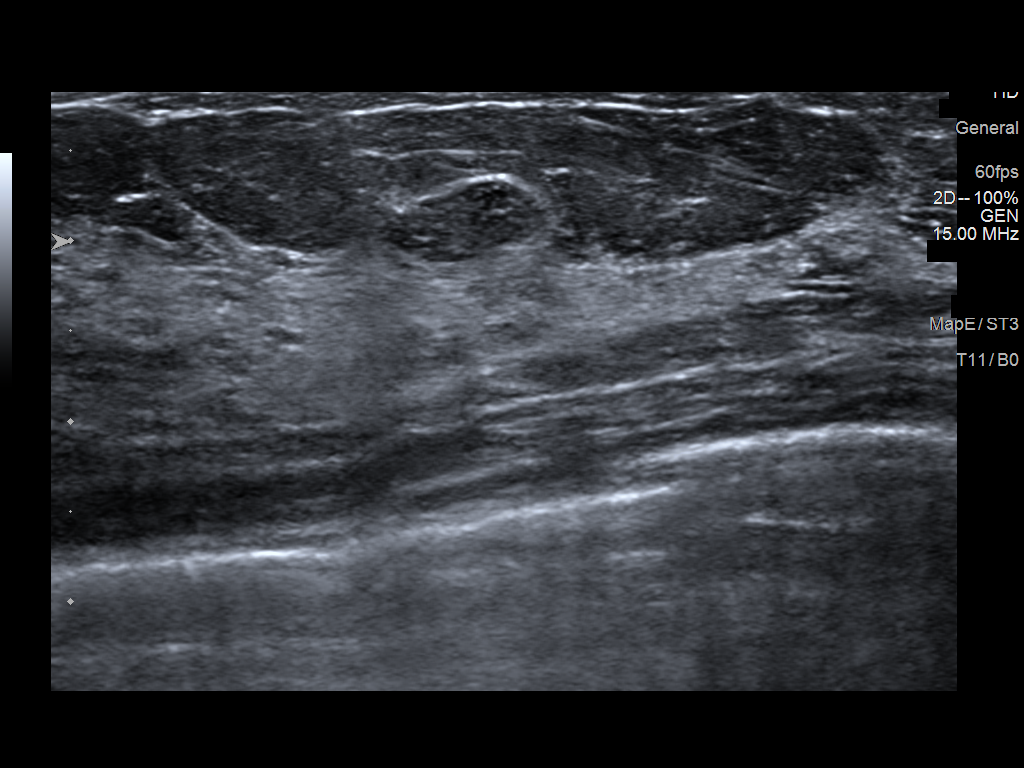
[im 2/6]
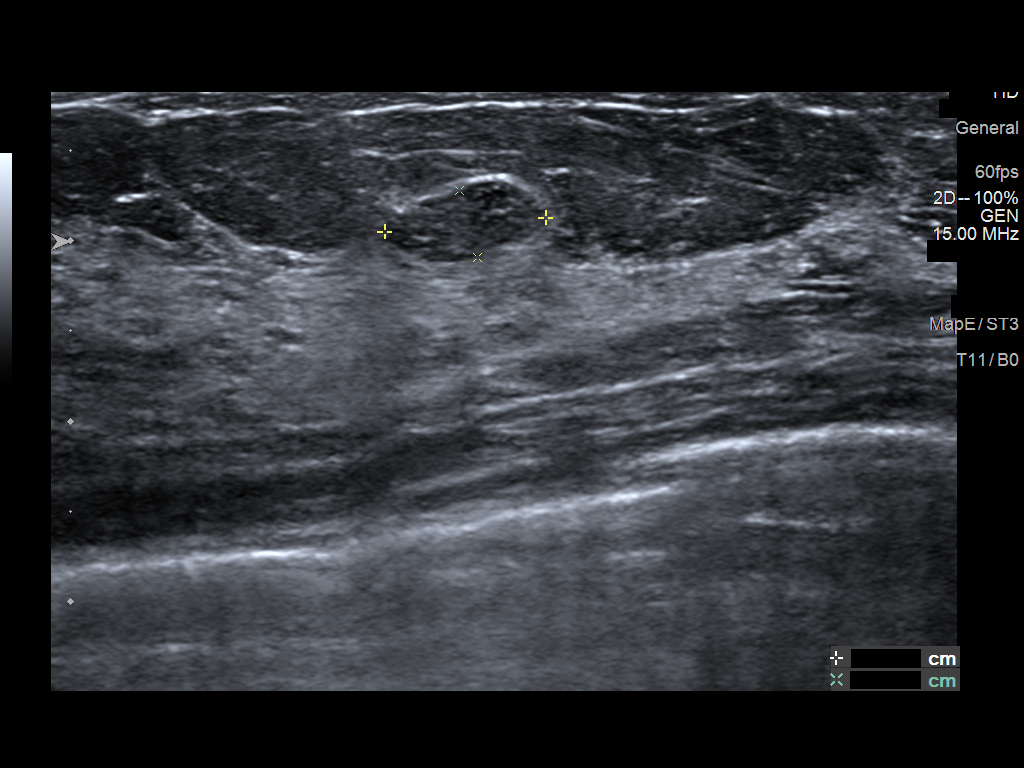
[im 3/6]
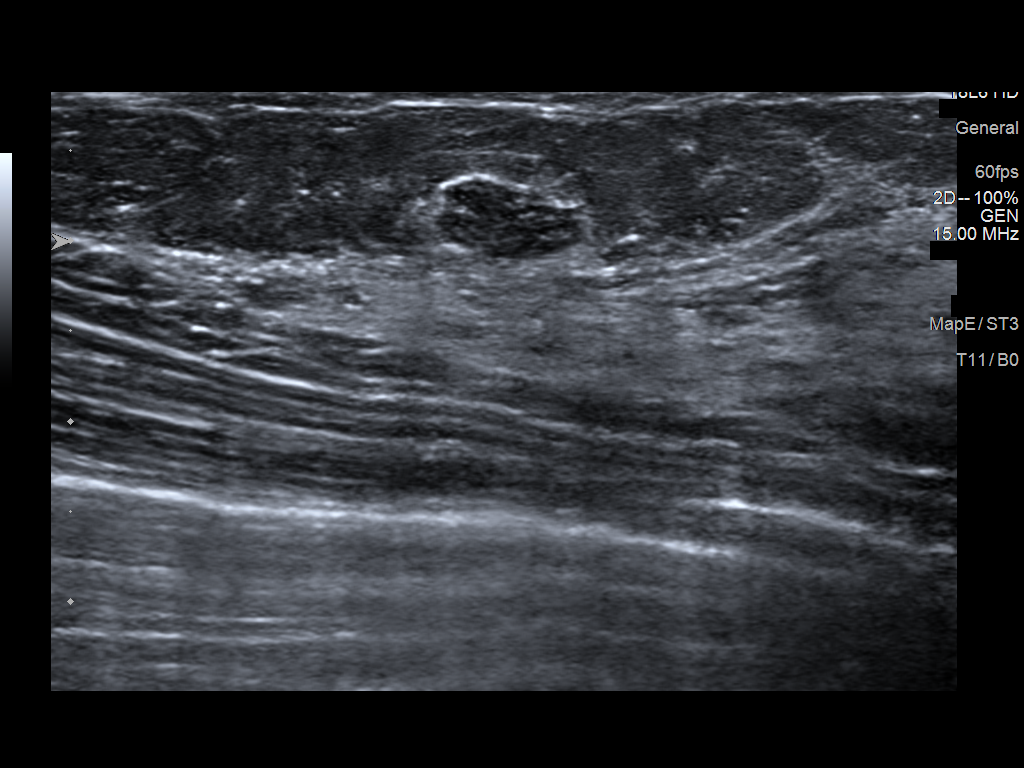
[im 4/6]
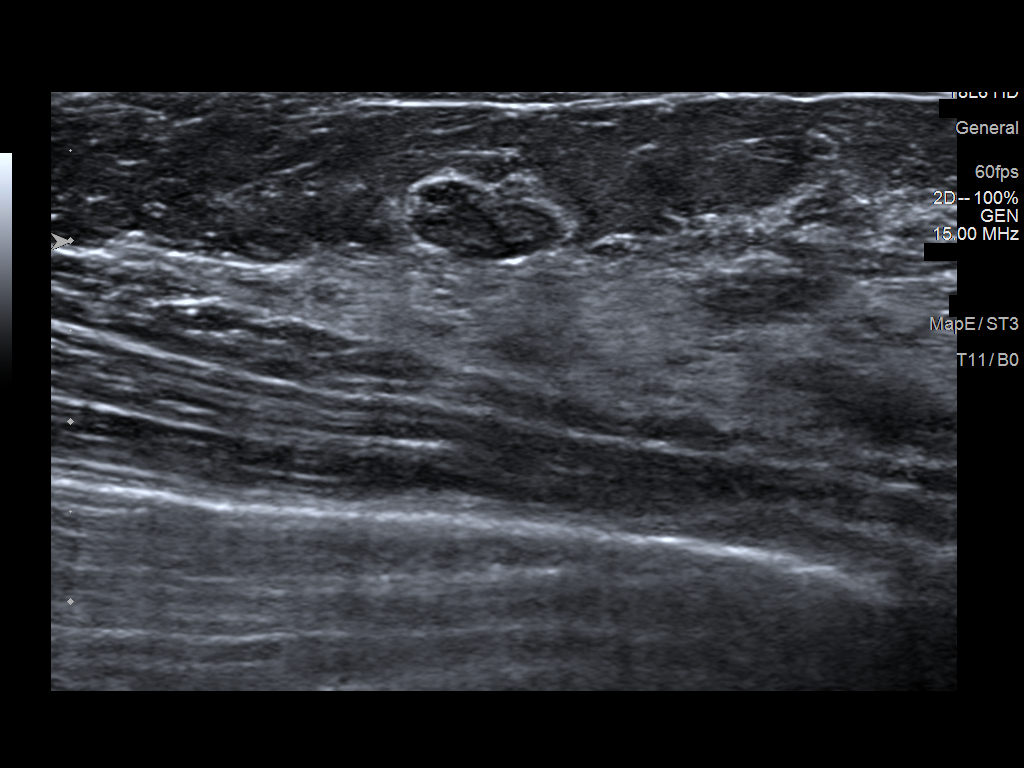
[im 5/6]
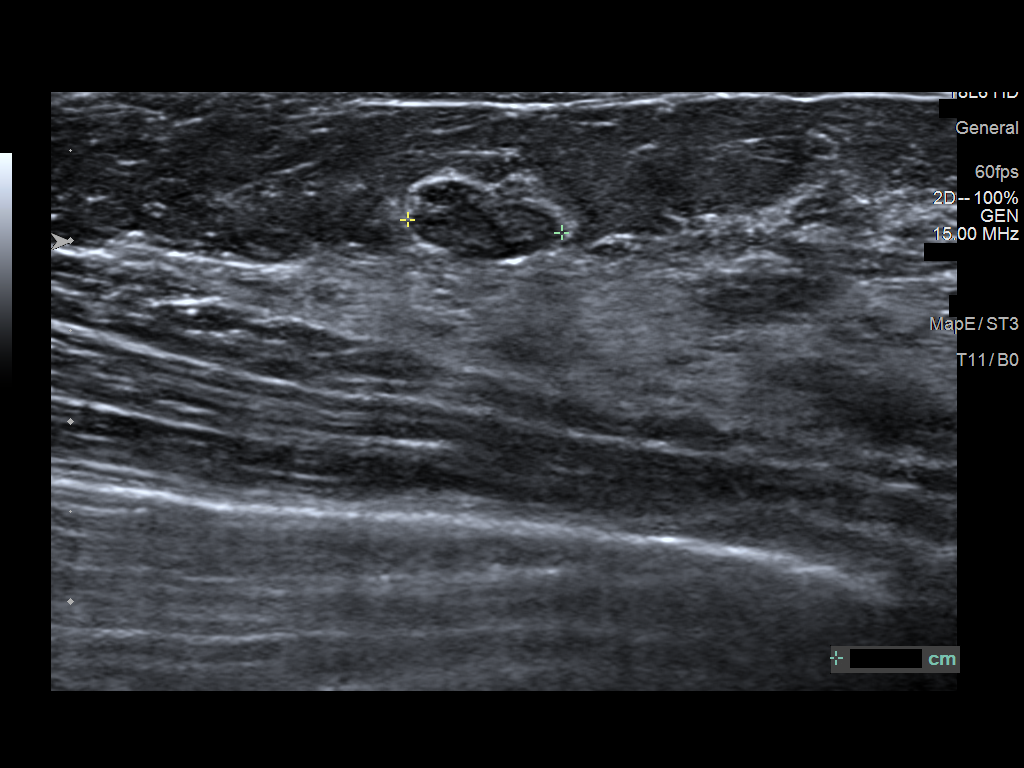
[im 6/6]
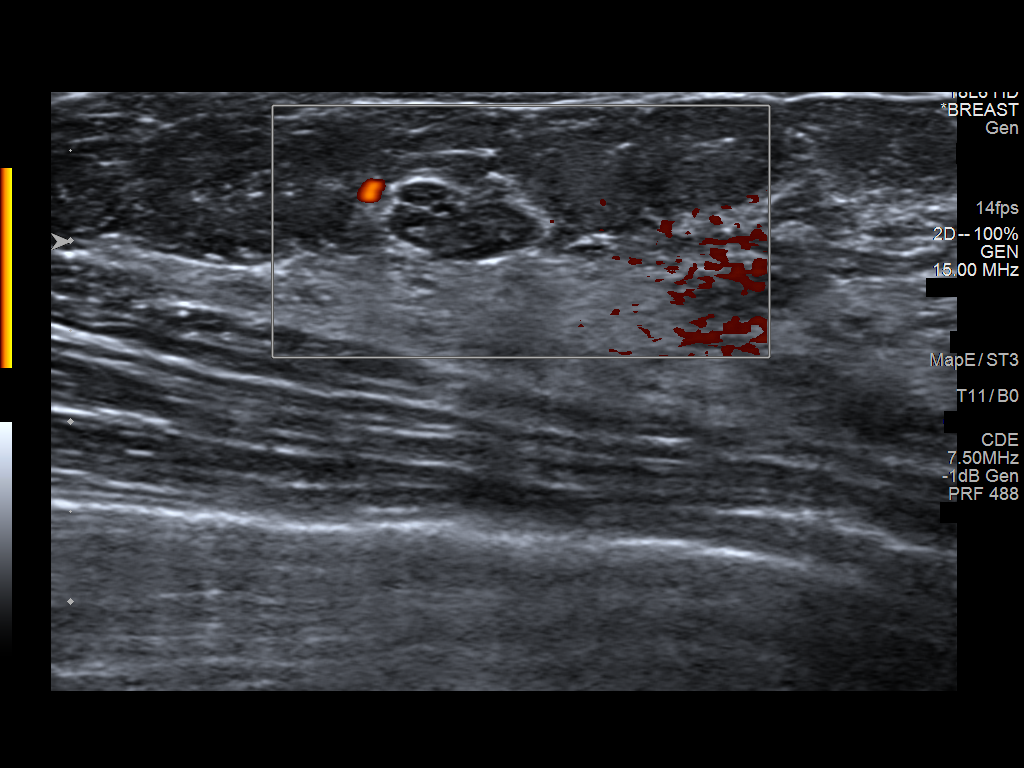

[6 of 6 positions shown; findings below may reference images not displayed]

FINDINGS: Ultrasound of the left breast at [DATE], 5 cm from the nipple
demonstrates a stable oval hypoechoic mass measuring 9 x 4 x 9 mm.
IMPRESSION: The likely benign mass in the left breast at [DATE] is stable.

RECOMMENDATION:
Bilateral diagnostic mammogram and left breast ultrasound in 1 year.

I have discussed the findings and recommendations with the patient.
If applicable, a reminder letter will be sent to the patient
regarding the next appointment.

BI-RADS CATEGORY  3: Probably benign.

## 2022-01-11 ENCOUNTER — Other Ambulatory Visit: Payer: Self-pay | Admitting: Family

## 2022-03-08 ENCOUNTER — Ambulatory Visit (INDEPENDENT_AMBULATORY_CARE_PROVIDER_SITE_OTHER): Payer: No Typology Code available for payment source | Admitting: Family

## 2022-03-08 ENCOUNTER — Encounter: Payer: Self-pay | Admitting: Family

## 2022-03-08 ENCOUNTER — Other Ambulatory Visit (HOSPITAL_COMMUNITY)
Admission: RE | Admit: 2022-03-08 | Discharge: 2022-03-08 | Disposition: A | Discharge status: Home / Self Care | Payer: No Typology Code available for payment source | Source: Ambulatory Visit | Attending: Family | Admitting: Family

## 2022-03-08 VITALS — BP 115/74 | HR 70 | Temp 98.0°F | Resp 16 | Ht 66.0 in | Wt 124.8 lb

## 2022-03-08 DIAGNOSIS — N926 Irregular menstruation, unspecified: Secondary | ICD-10-CM | POA: Diagnosis not present

## 2022-03-08 DIAGNOSIS — Z124 Encounter for screening for malignant neoplasm of cervix: Secondary | ICD-10-CM | POA: Insufficient documentation

## 2022-03-08 DIAGNOSIS — M064 Inflammatory polyarthropathy: Secondary | ICD-10-CM | POA: Diagnosis not present

## 2022-03-08 DIAGNOSIS — Z0000 Encounter for general adult medical examination without abnormal findings: Secondary | ICD-10-CM | POA: Diagnosis not present

## 2022-03-08 DIAGNOSIS — N6320 Unspecified lump in the left breast, unspecified quadrant: Secondary | ICD-10-CM | POA: Diagnosis not present

## 2022-03-08 DIAGNOSIS — R768 Other specified abnormal immunological findings in serum: Secondary | ICD-10-CM | POA: Insufficient documentation

## 2022-03-08 LAB — POCT URINE PREGNANCY: Preg Test, Ur: NEGATIVE

## 2022-03-08 MED ORDER — LEVONORGESTREL-ETHINYL ESTRAD 0.1-20 MG-MCG PO TABS: 1.0000 | ORAL_TABLET | Freq: Every day | ORAL | 3 refills | Status: DC

## 2022-03-08 NOTE — Assessment & Plan Note (Deleted)
She is requesting new referral for local rheumatology. She was previously followed at [facility].

## 2022-03-08 NOTE — Assessment & Plan Note (Signed)
Continue healthy diet and regular exercise. Recommended flu shot and covid booster this fall.  Pap performed.

## 2022-03-08 NOTE — Assessment & Plan Note (Signed)
Skipped period this month. Urine HCG is negative.

## 2022-03-08 NOTE — Progress Notes (Signed)
Subjective:     Patient ID: Gina Butler, female    DOB: August 16, 1980, 41 y.o.   MRN: [id number]  Chief Complaint  Patient presents with   Annual Exam    Here for Annual Exam     HPI  Patient presents today for complete physical.  Immunizations: tdap 2014, has had another at [facility] and will send [place] this info. Diet: healthy Exercise:  15 minutes a day Pap Smear: due Mammogram:  due Vision: up to date Dental: up to date     Health Maintenance Due  Topic Date Due   HIV Screening  Never done   Hepatitis C Screening  Never done   PAP SMEAR-Modifier  06/02/2021   INFLUENZA VACCINE  02/27/2022    Past Medical History:  Diagnosis Date   Arthritis    Basal cell carcinoma    History of chicken pox    Subclinical hypothyroidism 06/06/2018    Past Surgical History:  Procedure Laterality Date   deep tissue biopsy Right 01/27/2009   ankle.  "arthritis"   MOHS SURGERY  2022   basal cell carcinoma removal from forehead   OTHER SURGICAL HISTORY Right 07/30/2008   ankle-- deep tissue biopsy    Family History  Problem Relation Age of Onset   Arthritis Mother        osteoarthritis   Osteopenia Mother    Skin cancer Father    Obesity Sister    Arthritis Maternal Grandmother    Stroke Maternal Grandfather    Cancer Maternal Grandfather 94       colon   Urticaria Maternal Aunt    Cancer Maternal Aunt        breast   Breast cancer Maternal Aunt    Schizophrenia Maternal Aunt    Allergic rhinitis Neg Hx    Angioedema Neg Hx    Eczema Neg Hx    Immunodeficiency Neg Hx     Social History   Socioeconomic History   Marital status: Married    Spouse name: Not on file   Number of children: 0   Years of education: Not on file   Highest education level: Not on file  Occupational History    Employer: [employer]  Tobacco Use   Smoking status: Never   Smokeless tobacco: Never  Vaping Use   Vaping Use: Never used  Substance and Sexual Activity    Alcohol use: Yes    Alcohol/week: 2.0 - 4.0 standard drinks of alcohol    Types: 2 - 4 Standard drinks or equivalent per week   Drug use: No   Sexual activity: Yes    Partners: Male    Birth control/protection: Pill  Other Topics Concern   Not on file  Social History Narrative      Married   No children   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 1 each 1   azelastine (ASTELIN)  0.1 % nasal spray Use 1 to 2 sprays each nostril twice a day as needed for runny nose/drainage down the throat 30 mL 3   fluticasone (FLONASE) 50 MCG/ACT nasal spray Use 1 to 2 sprays each nostril once a day as needed for stuffy nose 16 g 3   cetirizine (ZYRTEC) 10 MG tablet Take 10 mg by mouth 2 (two) times daily.     famotidine (PEPCID) 20 MG tablet Take one tablet twice daily for hives 180 tablet 0   levonorgestrel-ethinyl estradiol (LUTERA) 0.1-20 MG-MCG tablet Take 1 tablet by mouth daily. 84 tablet 0   No facility-administered medications prior to visit.    Allergies  Allergen Reactions   Compazine [Prochlorperazine Edisylate] Other (See Comments)    "Affected the muscles in my neck, could not move my head."   Sulfa Antibiotics     Childhood reaction    Review of Systems  Constitutional:  Negative for weight loss.  HENT:  Negative for congestion.   Respiratory:  Negative for cough.   Cardiovascular:  Negative for leg swelling.  Gastrointestinal:  Negative for constipation  and diarrhea.  Genitourinary:  Negative for dysuria.  Skin:  Positive for rash (occasional urticaria).  Psychiatric/Behavioral:  Negative for depression.        Objective:    Physical Exam  BP 115/74 (BP Location: Right Arm, Patient Position: Sitting, Cuff Size: Normal)   Pulse 70   Temp 98 F (36.7 C) (Oral)   Resp 16   Ht '5\' 6"'$  (1.676 m)   Wt 124 lb 12.8 oz (56.6 kg)   SpO2 99%   BMI 20.14 kg/m  Wt Readings from Last 3 Encounters:  03/08/22 124 lb 12.8 oz (56.6 kg)  08/29/21 128 lb 9.6 oz (58.3 kg)  02/02/21 128 lb 6.4 oz (58.2 kg)  Physical Exam  Constitutional: She is oriented to person, place, and time. She appears well-developed and well-nourished. No distress.  HENT:  Head: Normocephalic and atraumatic.  Right Ear: Tympanic membrane and ear canal normal.  Left Ear: Tympanic membrane and ear canal normal.  Mouth/Throat: Oropharynx is clear and moist.  Eyes: Pupils are equal, round, and reactive to light. No scleral icterus.  Neck: Normal range of motion. No thyromegaly present.  Cardiovascular: Normal rate and regular rhythm.   No murmur heard. Pulmonary/Chest: Effort normal and breath sounds normal. No respiratory distress. He has no wheezes. She has no rales. She exhibits no tenderness.  Abdominal: Soft. Bowel sounds are normal. She exhibits no distension and no mass. There is no tenderness. There is no rebound and no guarding.  Musculoskeletal: She exhibits no edema.  Lymphadenopathy:    She has no cervical adenopathy.  Neurological: She is alert and oriented to person, place, and time. She has normal patellar reflexes. She exhibits normal muscle tone. Coordination normal.  Skin: Skin is warm and dry.  Psychiatric: She has a normal mood and affect. Her behavior is normal. Judgment and thought content normal.  Breasts: Examined lying Right: Without masses, retractions, discharge or axillary adenopathy.  Left: Without masses, retractions, discharge or axillary  adenopathy.  Inguinal/mons: Normal without inguinal adenopathy  External genitalia: Normal  BUS/Urethra/Skene's glands: Normal  Bladder: Normal  Vagina: Normal  Cervix: Normal  Uterus: normal in size, shape and contour. Midline and mobile  Adnexa/parametria:  Rt: Without masses or tenderness.  Lt: Without masses or tenderness.  Anus and perineum: Normal            Assessment & Plan:  Assessment & Plan:   Problem List Items Addressed This Visit       Unprioritized   Preventative health care    Continue healthy diet and regular exercise. Recommended flu shot and covid booster this fall.  Pap performed.       Missed period    Skipped period this month. Urine HCG is negative.       Relevant Orders   POCT urine pregnancy (Completed)   Mass of left breast on mammogram - Primary    Overdue for follow up diagnostic mammo/US. Orders places.       Relevant Orders   MM Digital Diagnostic Bilat   US BREAST LTD UNI RIGHT INC AXILLA   Inflammatory polyarthritis ([place])    She is requesting new referral for local rheumatology. She was previously followed at [facility].       Relevant Orders   Ambulatory referral to Rheumatology   Other Visit Diagnoses     Cervical cancer screening       Relevant Orders   Cytology - PAP( [place])       I have discontinued Sabine L. Soley's cetirizine and famotidine. I am also having her maintain her fluticasone, azelastine, albuterol, and levonorgestrel-ethinyl estradiol.  Meds ordered this encounter  Medications   levonorgestrel-ethinyl estradiol (LUTERA) 0.1-20 MG-MCG tablet    Sig: Take 1 tablet by mouth daily.    Dispense:  84 tablet    Refill:  3    Order Specific Question:   Supervising Provider    Answer:   Penni Homans A [[id number]]

## 2022-03-08 NOTE — Assessment & Plan Note (Signed)
She is requesting new referral for local rheumatology. She was previously followed at [facility].

## 2022-03-08 NOTE — Assessment & Plan Note (Signed)
Overdue for follow up diagnostic mammo/US. Orders places.

## 2022-03-12 LAB — CYTOLOGY - PAP: Diagnosis: NEGATIVE

## 2022-04-10 ENCOUNTER — Other Ambulatory Visit: Payer: No Typology Code available for payment source

## 2022-05-16 ENCOUNTER — Ambulatory Visit
Admission: RE | Admit: 2022-05-16 | Discharge: 2022-05-16 | Disposition: A | Discharge status: Home / Self Care | Payer: No Typology Code available for payment source | Source: Ambulatory Visit | Attending: Family | Admitting: Family

## 2022-05-16 DIAGNOSIS — N6320 Unspecified lump in the left breast, unspecified quadrant: Secondary | ICD-10-CM

## 2022-05-29 ENCOUNTER — Ambulatory Visit: Payer: No Typology Code available for payment source | Attending: Internal Medicine | Admitting: Internal Medicine

## 2022-05-29 ENCOUNTER — Encounter: Payer: Self-pay | Admitting: Internal Medicine

## 2022-05-29 VITALS — BP 103/70 | HR 65 | Resp 14 | Ht 67.0 in | Wt 127.6 lb

## 2022-05-29 DIAGNOSIS — L989 Disorder of the skin and subcutaneous tissue, unspecified: Secondary | ICD-10-CM | POA: Diagnosis not present

## 2022-05-29 DIAGNOSIS — R768 Other specified abnormal immunological findings in serum: Secondary | ICD-10-CM

## 2022-05-29 DIAGNOSIS — L509 Urticaria, unspecified: Secondary | ICD-10-CM

## 2022-05-29 DIAGNOSIS — M064 Inflammatory polyarthropathy: Secondary | ICD-10-CM | POA: Diagnosis not present

## 2022-05-29 NOTE — Progress Notes (Signed)
Office Visit Note  Patient: Gina Butler             Date of Birth: May 19, 1981           MRN: [id number]             PCP: Debbrah Alar, NP Referring: Debbrah Alar, NP Visit Date: 05/29/2022 Occupation: Physics professor  Subjective:  New Patient (Initial Visit) (Issues with hives and skin as well as rheumatoid issues.)   History of Present Illness: Gina Butler is a 41 y.o. female here for evaluation of positive ANA associated with multiple joint pains and urticaria. She previously saw [facility]ology and was on treatment with hydroxychloroquine and methotrexate. She has a history of skin lesion on the ankle in 2013 with possible granulomatous disease or necrotizing vasculitis pathology. She had pain and swelling in both ankles and discoloration preceding this for some time. Also with pain and stiffness in her fingers and wrists without the visible changes. Symptoms improved with HCQ and MTX and was in remission after a few years. She was off all medication without problems. Return of symptoms in 2017 associated with life stressors with father passing away. Symptoms again improved with steroids and resuming medication until 2018 or 2019. She was off medication again and not seeing anyone regularly since [place]. Current problem is more related to hives for the past year. These come and go, itchy, lasting at least an hour not more than 24 hours, no residual changes unless excoriations. She takes zyrtec which is partially helpful, had been recommended to try higher doses but concerned about side effects and also not a clear diagnosis. She experiences purple discoloration in her hands sometimes following heat such as after a hot shower. Also experiences discoloration in her finger tips with cold possibly consistent with raynaud's phenomenon. She denies new alopecia, lymphadenopathy, oral or nasal ulcers, or abnormal bruising or bleeding.   04/2021 ANA 1:320 speckled ESR  8 Complement C4 21 TPO neg Tg neg CU Index neg  Activities of Daily Living:  Patient reports morning stiffness for 0 minutes.   Patient Denies nocturnal pain.  Difficulty dressing/grooming: Denies Difficulty climbing stairs: Denies Difficulty getting out of chair: Denies Difficulty using hands for taps, buttons, cutlery, and/or writing: Denies  Review of Systems  Constitutional:  Negative for fatigue.  HENT:  Negative for mouth sores and mouth dryness.   Eyes:  Negative for dryness.  Respiratory:  Negative for shortness of breath.   Cardiovascular:  Negative for chest pain and palpitations.  Gastrointestinal:  Negative for blood in stool, constipation and diarrhea.  Endocrine: Negative for increased urination.  Genitourinary:  Negative for involuntary urination.  Musculoskeletal:  Negative for joint pain, gait problem, joint pain, joint swelling, myalgias, muscle weakness, morning stiffness, muscle tenderness and myalgias.  Skin:  Positive for sensitivity to sunlight. Negative for color change, rash and hair loss.  Allergic/Immunologic: Negative for susceptible to infections.  Neurological:  Negative for dizziness and headaches.  Hematological:  Negative for swollen glands.  Psychiatric/Behavioral:  Negative for depressed mood and sleep disturbance. The patient is not nervous/anxious.     PMFS History:  Patient Active Problem List   Diagnosis Date Noted   Positive ANA (antinuclear antibody) 03/08/2022   Mass of left breast on mammogram 03/08/2022   Urticaria 12/02/2020   Skin lesion 12/02/2020   Subclinical hypothyroidism 06/06/2018   Preventative health care 01/05/2013   Inflammatory polyarthritis ([place]) 01/05/2013   H/O long-term treatment with high-risk medication 07/09/2011  Past Medical History:  Diagnosis Date   Arthritis    Basal cell carcinoma    History of chicken pox    Subclinical hypothyroidism 06/06/2018    Family History  Problem Relation Age of Onset    Arthritis Mother        osteoarthritis   Osteopenia Mother    Skin cancer Father    OCD Sister    Urticaria Maternal Aunt    Cancer Maternal Aunt        breast   Breast cancer Maternal Aunt    Schizophrenia Maternal Aunt    Arthritis Maternal Grandmother    Stroke Maternal Grandfather    Cancer Maternal Grandfather 94       colon   Allergic rhinitis Neg Hx    Angioedema Neg Hx    Eczema Neg Hx    Immunodeficiency Neg Hx    Past Surgical History:  Procedure Laterality Date   deep tissue biopsy Right 01/27/2009   ankle.  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         Immunization History  Administered Date(s) Administered   Influenza Inj Mdck Quad Pf 05/25/2018, 05/25/2018   Influenza,inj,Quad PF,6+ Mos 05/26/2020   Influenza,inj,Quad PF,6-35 Mos 05/14/2019   PFIZER(Purple Top)SARS-COV-2 Vaccination 10/02/2019, 10/30/2019, 05/02/2020   PPD Test 12/29/2008   Tdap 01/05/2013     Objective: Vital Signs: BP 103/70 (BP Location: Right Arm, Patient Position: Sitting, Cuff Size: Normal)   Pulse 65   Resp 14   Ht _0  (1.702 m)   Wt 127 lb 9.6 oz (57.9 kg)   LMP 05/21/2022 (Exact Date)   BMI 19.98 kg/m    Physical Exam HENT:     Right Ear: External ear normal.     Left Ear: External ear normal.     Mouth/Throat:     Mouth: Mucous membranes are moist.     Pharynx: Oropharynx is clear.  Eyes:     Conjunctiva/sclera: Conjunctivae normal.  Cardiovascular:     Rate and Rhythm: Normal rate and regular rhythm.     Heart sounds: No murmur heard. Pulmonary:     Effort: Pulmonary effort is normal.     Breath sounds: Normal breath sounds.  Musculoskeletal:     Right lower leg: No edema.     Left lower leg: No edema.   Lymphadenopathy:     Cervical: No cervical adenopathy.  Skin:    General: Skin is warm and dry.     Findings: No rash.     Comments: Very dry skin on hands  Neurological:     Mental Status: She is alert.  Psychiatric:        Mood and Affect: Mood normal.      Musculoskeletal Exam:  Neck full ROM no tenderness Shoulders full ROM no tenderness or swelling Elbows full ROM no tenderness or swelling Wrists full ROM no tenderness or swelling Fingers full ROM no tenderness or swelling Knees full ROM no tenderness or swelling Ankles full ROM no tenderness or swelling High arches, callus under 5th MTP both sides  Investigation: No additional findings.  Imaging: No results found.  Recent Labs: Lab Results  Component Value Date  WBC 5.9 05/23/2021   HGB 14.0 05/23/2021   PLT 237.0 08/25/2019   NA 140 05/23/2021   K 4.2 05/23/2021   CL 101 05/23/2021   CO2 23 05/23/2021   GLUCOSE 83 05/23/2021   BUN 8 05/23/2021   CREATININE 0.78 05/23/2021   BILITOT 0.6 05/23/2021   ALKPHOS 67 05/23/2021   AST 21 05/23/2021   ALT 14 05/23/2021   PROT 7.1 05/23/2021   ALBUMIN 4.5 05/23/2021   CALCIUM 9.4 05/23/2021   GFRAA >89 01/08/2013    Speciality Comments: No specialty comments available.  Procedures:  No procedures performed Allergies: Compazine [prochlorperazine edisylate] and Sulfa antibiotics   Assessment / Plan:     Visit Diagnoses: Positive ANA (antinuclear antibody) - Plan: Anti-scleroderma antibody, RNP Antibody, Anti-Smith antibody, Sjogrens syndrome-A extractable nuclear antibody, Sjogrens syndrome-B extractable nuclear antibody, Anti-DNA antibody, double-stranded, C3 and C4, Rheumatoid factor, Cyclic citrul peptide antibody, IgG, C-reactive protein, Myositis Assessr Plus Jo-1 Autoabs  Positive ANA with a history of some autoimmune problems previously had inflammatory joint disease and skin disease but had all been in remission.  Will check extensive disease specific  antibody profile today including myositis antibody panel rheumatoid factor CCP serum complements and C-reactive protein.  If results are all negative at this time would attribute symptoms to more of a chronic idiopathic urticaria picture but without specific indication to start immunomodulatory drug unless new or worsening symptoms.  Inflammatory polyarthritis ([place])  Previous history of inflammatory joint disease has been treated with hydroxychloroquine and methotrexate but not active for several years.  I do not see any objective synovitis present on exam today.  Urticaria Skin lesion  Recent skin rashes seem most consistent with urticaria without concerning features for urticarial vasculitis.  Can be associated with positive ANA in many cases even without specific underlying systemic autoimmune disease.  Discussed use of antihistamines at higher dose or frequency than over-the-counter recommended may be beneficial in cases of chronic idiopathic urticaria although disease process tends to be self-limited that is usually after at least several months.  Orders: Orders Placed This Encounter  Procedures   Anti-scleroderma antibody   RNP Antibody   Anti-Smith antibody   Sjogrens syndrome-A extractable nuclear antibody   Sjogrens syndrome-B extractable nuclear antibody   Anti-DNA antibody, double-stranded   C3 and C4   Rheumatoid factor   Cyclic citrul peptide antibody, IgG   C-reactive protein   Myositis Assessr Plus Jo-1 Autoabs   Myositis Specific II Antibodies Panel   No orders of the defined types were placed in this encounter.    Follow-Up Instructions: Return if symptoms worsen or fail to improve.   Collier Salina, MD  Note - This record has been created using [employer].  Chart creation errors have been sought, but may not always  have been located. Such creation errors do not reflect on  the standard of medical care.

## 2022-06-05 LAB — RHEUMATOID FACTOR: Rheumatoid fact SerPl-aCnc: <14 IU/mL (ref <14)

## 2022-06-05 LAB — SJOGRENS SYNDROME-A EXTRACTABLE NUCLEAR ANTIBODY: SSA (Ro) (ENA) Antibody, IgG: <1 AI

## 2022-06-05 LAB — ANTI-DNA ANTIBODY, DOUBLE-STRANDED: ds DNA Ab: <1 IU/mL

## 2022-06-05 LAB — ANTI-SCLERODERMA ANTIBODY: Scleroderma (Scl-70) (ENA) Antibody, IgG: <1 AI

## 2022-06-05 LAB — C-REACTIVE PROTEIN: CRP: 1.2 mg/L (ref <8.0)

## 2022-06-05 LAB — MYOSITIS SPECIFIC II ANTIBODIES PANEL
EJ AB: <11 SI (ref <11)
JO-1 AB: <11 SI (ref <11)
MDA-5 AB: <11 SI (ref <11)
MI-2 ALPHA AB: <11 SI (ref <11)
MI-2 BETA AB: <11 SI (ref <11)
NXP-2 AB: <11 SI (ref <11)
OJ AB: <11 SI (ref <11)
PL-12 AB: <11 SI (ref <11)
PL-7 AB: <11 SI (ref <11)
SRP-AB: <11 SI (ref <11)
TIF-1y AB: <11 SI (ref <11)

## 2022-06-05 LAB — CYCLIC CITRUL PEPTIDE ANTIBODY, IGG: Cyclic Citrullin Peptide Ab: <16 UNITS

## 2022-06-05 LAB — C3 AND C4
C3 Complement: 96 mg/dL (ref 83–193)
C4 Complement: 18 mg/dL (ref 15–57)

## 2022-06-05 LAB — SJOGRENS SYNDROME-B EXTRACTABLE NUCLEAR ANTIBODY: SSB (La) (ENA) Antibody, IgG: <1 AI

## 2022-06-05 LAB — ANTI-SMITH ANTIBODY: ENA SM Ab Ser-aCnc: <1 AI

## 2022-06-05 LAB — RNP ANTIBODY: Ribonucleic Protein(ENA) Antibody, IgG: <1 AI

## 2023-02-27 ENCOUNTER — Encounter (INDEPENDENT_AMBULATORY_CARE_PROVIDER_SITE_OTHER): Payer: Self-pay

## 2023-03-02 ENCOUNTER — Encounter: Payer: Self-pay | Admitting: Family

## 2023-03-04 ENCOUNTER — Other Ambulatory Visit: Payer: Self-pay

## 2023-03-04 MED ORDER — LEVONORGESTREL-ETHINYL ESTRAD 0.1-20 MG-MCG PO TABS: 1.0000 | ORAL_TABLET | Freq: Every day | ORAL | 3 refills | Status: DC

## 2023-03-22 ENCOUNTER — Telehealth (HOSPITAL_BASED_OUTPATIENT_CLINIC_OR_DEPARTMENT_OTHER): Payer: Self-pay

## 2023-03-22 ENCOUNTER — Ambulatory Visit (INDEPENDENT_AMBULATORY_CARE_PROVIDER_SITE_OTHER): Payer: No Typology Code available for payment source | Admitting: Family

## 2023-03-22 ENCOUNTER — Encounter: Payer: Self-pay | Admitting: Family

## 2023-03-22 VITALS — BP 128/81 | HR 91 | Temp 98.6°F | Resp 16 | Ht 67.0 in | Wt 131.0 lb

## 2023-03-22 DIAGNOSIS — Z114 Encounter for screening for human immunodeficiency virus [HIV]: Secondary | ICD-10-CM

## 2023-03-22 DIAGNOSIS — Z0000 Encounter for general adult medical examination without abnormal findings: Secondary | ICD-10-CM

## 2023-03-22 DIAGNOSIS — M064 Inflammatory polyarthropathy: Secondary | ICD-10-CM

## 2023-03-22 DIAGNOSIS — Z1231 Encounter for screening mammogram for malignant neoplasm of breast: Secondary | ICD-10-CM

## 2023-03-22 DIAGNOSIS — N6320 Unspecified lump in the left breast, unspecified quadrant: Secondary | ICD-10-CM

## 2023-03-22 DIAGNOSIS — Z1159 Encounter for screening for other viral diseases: Secondary | ICD-10-CM

## 2023-03-22 NOTE — Assessment & Plan Note (Signed)
Had negative US/Diagnositic mammo next year. Repeat mammo screening will be due in October.

## 2023-03-22 NOTE — Patient Instructions (Signed)
VISIT SUMMARY:  During your annual physical, we discussed your occasional left knee pain and the new popping noise you've been experiencing. We also talked about your general health maintenance, including preventive care labs and your upcoming mammogram. You are satisfied with your current birth control pill and dosage.  YOUR PLAN:  -LEFT KNEE PAIN: This is a new symptom you've been experiencing, with no clear cause yet. If the pain continues, we may consider referring you to a sports medicine specialist for further evaluation. Just let us know if you would like [place] to place this referral.  -GENERAL HEALTH MAINTENANCE: We will order one-time HIV and Hepatitis C screenings, as well as preventive care labs including cholesterol, to give you a comprehensive view of your health. You should also schedule a dental check-up. Continue taking your birth control as you are satisfied with the current pill and dosage. Your mammogram will be due after 10/18 this year.

## 2023-03-22 NOTE — Assessment & Plan Note (Signed)
Encouraged her to increase exercise. Continue healthy diet.  Update mammo in March. Will request tetanus date from [facility]. Pap up to date. She is encouraged to schedule her dental appointment.

## 2023-03-22 NOTE — Progress Notes (Signed)
Subjective:     Patient ID: Gina Butler, female    DOB: 12/24/1980, 42 y.o.   MRN: [id number]  No chief complaint on file.   HPI  Discussed the use of AI scribe software for clinical note transcription with the patient, who gave verbal consent to proceed.The patient, with a history of joint pain, presents for an annual physical. She reports a new popping noise in her left knee that started a couple of months ago. The knee pain is occasional. She had a rheumatology referral last year with a series of blood tests, all of which were normal. She is considering a referral to sports medicine if the knee issue persists.  The patient also mentions that she had a tetanus shot at [facility] after 2014, but the exact date is unclear. She is also considering a full blood work-up for preventive care, including cholesterol, as she is now 42 and would like a benchmark for her health. Pap is up to date, mammogram will be due in   The patient is currently on birth control pills and is satisfied with the dose and pill. She reports no other health issues or concerns. She is due for a mammogram after October 18th.    Health Maintenance Due  Topic Date Due   HIV Screening  Never done   Hepatitis C Screening  Never done   DTaP/Tdap/Td (2 - Td or Tdap) 01/06/2023   INFLUENZA VACCINE  02/28/2023    Past Medical History:  Diagnosis Date   Arthritis    Basal cell carcinoma    History of chicken pox    Subclinical hypothyroidism 06/06/2018    Past Surgical History:  Procedure Laterality Date   deep tissue biopsy Right 01/27/2009   ankle.  "arthritis"   MOHS SURGERY  2022   basal cell carcinoma removal from forehead   OTHER SURGICAL HISTORY Right 07/30/2008   ankle-- deep tissue biopsy    Family History  Problem Relation Age of Onset   Arthritis Mother        osteoarthritis   Osteopenia Mother    Skin cancer Father    OCD Sister    Urticaria Maternal Aunt    Cancer Maternal Aunt         breast   Breast cancer Maternal Aunt    Schizophrenia Maternal Aunt    Arthritis Maternal Grandmother    Stroke Maternal Grandfather    Cancer Maternal Grandfather 86       colon   Allergic rhinitis Neg Hx    Angioedema Neg Hx    Eczema Neg Hx    Immunodeficiency Neg Hx     Social History   Socioeconomic History   Marital status: Married    Spouse name: Not on file   Number of children: 0   Years of education: Not on file   Highest education level: Not on file  Occupational History    Employer: [employer]  Tobacco Use   Smoking status: Never    Passive exposure: Never   Smokeless tobacco: Never  Vaping Use   Vaping status: Never Used  Substance and Sexual Activity   Alcohol use: Yes    Alcohol/week: 2.0 - 4.0 standard drinks of alcohol    Types: 2 - 4 Standard drinks or equivalent per week   Drug use: No   Sexual activity: Yes    Partners: Male    Birth control/protection: Pill  Other Topics Concern   Not on file  Social  History Narrative   Married   No children   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 cooking         Social Determinants of Corporate investment banker Strain: Not on [employer] Insecurity: Not on file  Transportation Needs: Not on file  Physical Activity: Not on file  Stress: Not on file  Social Connections: Not on file  Intimate Partner Violence: Not on file    Outpatient Medications Prior to Visit  Medication Sig Dispense Refill   albuterol (PROVENTIL HFA) 108 (90 Base) MCG/ACT inhaler Inhale 2 puffs into the lungs every 4 (four) hours as needed for wheezing or shortness of breath. 1 each 1   azelastine (ASTELIN) 0.1 % nasal spray Use 1 to 2 sprays each nostril twice a day as needed for runny nose/drainage down the throat 30 mL 3   fluticasone (FLONASE) 50 MCG/ACT nasal spray Use 1 to 2 sprays each nostril once a day as needed for stuffy nose 16 g 3   ibuprofen (ADVIL) 600 MG tablet Take 600 mg by mouth as  needed.     levonorgestrel-ethinyl estradiol (LUTERA) 0.1-20 MG-MCG tablet Take 1 tablet by mouth daily. 84 tablet 3   LORATADINE ALLERGY RELIEF PO Take by mouth.     No facility-administered medications prior to visit.    Allergies  Allergen Reactions   Compazine [Prochlorperazine Edisylate] Other (See Comments)    "Affected the muscles in my neck, could not move my head."   Sulfa Antibiotics     Childhood reaction    Review of Systems  Constitutional:  Negative for weight loss.  HENT:  Negative for congestion and hearing loss.   Eyes:  Negative for blurred vision.  Respiratory:  Negative for cough and shortness of breath.   Cardiovascular:  Negative for chest pain.  Gastrointestinal:  Negative for constipation and diarrhea.  Genitourinary:  Negative for dysuria and frequency.  Musculoskeletal:  Positive for joint pain (left knee).  Skin:  Negative for rash.  Neurological:  Negative for headaches.  Psychiatric/Behavioral:         Denies depression or anxiety       Objective:    Physical Exam   BP 128/81 (BP Location: Right Arm, Patient Position: Sitting, Cuff Size: Small)   Pulse 91   Temp 98.6 F (37 C) (Oral)   Resp 16   Ht 5\' 7"  (1.702 m)   Wt 131 lb (59.4 kg)   SpO2 99%   BMI 20.52 kg/m  Wt Readings from Last 3 Encounters:  03/22/23 131 lb (59.4 kg)  05/29/22 127 lb 9.6 oz (57.9 kg)  03/08/22 124 lb 12.8 oz (56.6 kg)  Physical Exam  Constitutional: She is oriented to person, place, and time. She appears well-developed and well-nourished. No distress.  HENT:  Head: Normocephalic and atraumatic.  Right Ear: Tympanic membrane and ear canal normal.  Left Ear: Tympanic membrane and ear canal normal.  Mouth/Throat: Oropharynx is clear and moist.  Eyes: Pupils are equal, round, and reactive to light. No scleral icterus.  Neck: Normal range of motion. No thyromegaly present.  Cardiovascular: Normal rate and regular rhythm.   No murmur heard. Pulmonary/Chest:  Effort normal and breath sounds normal. No respiratory distress. He has no wheezes. She has no rales. She exhibits no tenderness.  Abdominal: Soft. Bowel sounds are normal. She exhibits no distension and no mass. There is no tenderness. There is no rebound and no guarding.  Musculoskeletal: She exhibits no  edema.  Lymphadenopathy:    She has no cervical adenopathy.  Neurological: She is alert and oriented to person, place, and time. She has normal patellar reflexes. She exhibits normal muscle tone. Coordination normal.  Skin: Skin is warm and dry.  Psychiatric: She has a normal mood and affect. Her behavior is normal. Judgment and thought content normal.  Breast/pelvic: deferred          Assessment & Plan:        Assessment & Plan:   Problem List Items Addressed This Visit       Unprioritized   Preventative health care    Encouraged her to increase exercise. Continue healthy diet.  Update mammo in March. Will request tetanus date from [facility]. Pap up to date. She is encouraged to schedule her dental appointment.       Relevant Orders   CBC w/Diff   Comp Met (CMET)   TSH   Lipid panel   Mass of left breast on mammogram    Had negative US/Diagnositic mammo next year. Repeat mammo screening will be due in October.       Inflammatory polyarthritis (HCC)    Had normal work up at Rheumatology.  No current joint pain except occasional left knee discomfort. She will let me know if she decides to proceed with sports med referral.       Other Visit Diagnoses     Encounter for screening for HIV    -  Primary   Relevant Orders   HIV antibody (with reflex)   Encounter for hepatitis C screening test for low risk patient       Relevant Orders   Hepatitis C Antibody   Breast cancer screening by mammogram       Relevant Orders   MM 3D SCREENING MAMMOGRAM BILATERAL BREAST       I am having Darly L. Sinclair maintain her fluticasone, azelastine, albuterol, ibuprofen,  LORATADINE ALLERGY RELIEF PO, and levonorgestrel-ethinyl estradiol.  No orders of the defined types were placed in this encounter.

## 2023-03-22 NOTE — Assessment & Plan Note (Signed)
Had normal work up at Rheumatology.  No current joint pain except occasional left knee discomfort. She will let me know if she decides to proceed with sports med referral.

## 2023-03-23 LAB — COMPREHENSIVE METABOLIC PANEL
AG Ratio: 1.4 (calc) (ref 1.0–2.5)
ALT: 10 U/L (ref 6–29)
AST: 15 U/L (ref 10–30)
Albumin: 4.2 g/dL (ref 3.6–5.1)
Alkaline phosphatase (APISO): 55 U/L (ref 31–125)
BUN: 8 mg/dL (ref 7–25)
CO2: 24 mmol/L (ref 20–32)
Calcium: 9.3 mg/dL (ref 8.6–10.2)
Chloride: 102 mmol/L (ref 98–110)
Creat: 0.84 mg/dL (ref 0.50–0.99)
Globulin: 2.9 g/dL (ref 1.9–3.7)
Glucose, Bld: 128 mg/dL — ABNORMAL HIGH (ref 65–99)
Potassium: 4 mmol/L (ref 3.5–5.3)
Sodium: 138 mmol/L (ref 135–146)
Total Bilirubin: 0.6 mg/dL (ref 0.2–1.2)
Total Protein: 7.1 g/dL (ref 6.1–8.1)

## 2023-03-23 LAB — CBC WITH DIFFERENTIAL/PLATELET
Absolute Monocytes: 536 {cells}/uL (ref 200–950)
Basophils Absolute: 20 {cells}/uL (ref 0–200)
Basophils Relative: 0.3 %
Eosinophils Absolute: 34 {cells}/uL (ref 15–500)
Eosinophils Relative: 0.5 %
HCT: 40.6 % (ref 35.0–45.0)
Hemoglobin: 13.6 g/dL (ref 11.7–15.5)
Lymphs Abs: 1534 {cells}/uL (ref 850–3900)
MCH: 29.2 pg (ref 27.0–33.0)
MCHC: 33.5 g/dL (ref 32.0–36.0)
MCV: 87.1 fL (ref 80.0–100.0)
MPV: 10.5 fL (ref 7.5–12.5)
Monocytes Relative: 8 %
Neutro Abs: 4576 {cells}/uL (ref 1500–7800)
Neutrophils Relative %: 68.3 %
Platelets: 293 10*3/uL (ref 140–400)
RBC: 4.66 10*6/uL (ref 3.80–5.10)
RDW: 12 % (ref 11.0–15.0)
Total Lymphocyte: 22.9 %
WBC: 6.7 10*3/uL (ref 3.8–10.8)

## 2023-03-23 LAB — LIPID PANEL
Cholesterol: 170 mg/dL (ref <200)
HDL: 53 mg/dL (ref >=50)
LDL Cholesterol (Calc): 91 mg/dL
Non-HDL Cholesterol (Calc): 117 mg/dL (ref <130)
Total CHOL/HDL Ratio: 3.2 (calc) (ref <5.0)
Triglycerides: 165 mg/dL — ABNORMAL HIGH (ref <150)

## 2023-03-23 LAB — HEPATITIS C ANTIBODY: Hepatitis C Ab: NONREACTIVE

## 2023-03-23 LAB — TSH: TSH: 2.63 m[IU]/L

## 2023-03-23 LAB — HIV ANTIBODY (ROUTINE TESTING W REFLEX): HIV 1&2 Ab, 4th Generation: NONREACTIVE

## 2023-03-24 ENCOUNTER — Encounter: Payer: Self-pay | Admitting: Family

## 2023-04-15 ENCOUNTER — Telehealth (HOSPITAL_BASED_OUTPATIENT_CLINIC_OR_DEPARTMENT_OTHER): Payer: Self-pay

## 2024-01-29 ENCOUNTER — Other Ambulatory Visit: Payer: Self-pay | Admitting: Family

## 2024-03-18 ENCOUNTER — Other Ambulatory Visit: Payer: Self-pay | Admitting: Family

## 2024-03-18 DIAGNOSIS — Z1231 Encounter for screening mammogram for malignant neoplasm of breast: Secondary | ICD-10-CM

## 2024-03-24 ENCOUNTER — Ambulatory Visit: Admitting: Family

## 2024-03-24 VITALS — BP 109/69 | HR 56 | Temp 98.8°F | Resp 18 | Ht 67.0 in | Wt 132.6 lb

## 2024-03-24 DIAGNOSIS — R059 Cough, unspecified: Secondary | ICD-10-CM | POA: Diagnosis not present

## 2024-03-24 MED ORDER — OMEPRAZOLE 40 MG PO CPDR: 40.0000 mg | DELAYED_RELEASE_CAPSULE | Freq: Every day | ORAL | 3 refills | Status: AC

## 2024-03-24 NOTE — Patient Instructions (Signed)
 VISIT SUMMARY:  You visited us  today due to a persistent dry cough that has lasted for six weeks. We discussed potential causes and started a treatment plan.  YOUR PLAN:  CHRONIC COUGH: You have had a dry cough for six weeks, which is most likely due to silent reflux.  -Start taking omeprazole  for one month. -Monitor for any fever or wheezing and report these symptoms if they occur. -Follow up in one month to assess your response to the treatment. -If your symptoms resolve, continue taking omeprazole  and send a note to the provider. -If your symptoms persist, we may refer you for further evaluation.

## 2024-03-24 NOTE — Assessment & Plan Note (Signed)
 Normal lung exam.  Differential includes allergies, asthma, gerd, infectious etiology.  Suspect silent reflux as most likely cause. Will rx with omeprazole . Pt to call if symptoms worsen or if she develops fever. Otherwise will plan to meet back in 1 month.

## 2024-03-24 NOTE — Progress Notes (Signed)
 Subjective:     Patient ID: Gina Butler, female    DOB: [birth year], 43 y.o.   MRN: [id number]  Chief Complaint  Patient presents with   Cough    Onset: 6 weeks    Cough    Discussed the use of AI scribe software for clinical note transcription with the patient, who gave verbal consent to proceed.  History of Present Illness  Gina Butler is a 43 year old female who presents with a persistent dry cough for six weeks.  The dry cough is more pronounced when talking and worsens at night. She experiences chest pressure that triggers the urge to cough. There is no mucus production, fever, sneezing, sore throat, or wheezing. She denies reflux symptoms such as burping or heartburn.  She recalls experiencing cold-like symptoms during the summer, coinciding with her husband's illness, although he tested negative for COVID-19 twice. She does not feel ill otherwise.  Her current medication includes periodic use of loratadine  for hives and itchiness, not for seasonal allergies. She has no allergy  symptoms such as post nasal drip, itchy eyes, or sneezing. There is no hoarseness, fever, or wheezing.     Health Maintenance Due  Topic Date Due   Hepatitis B Vaccines 19-59 Average Risk (1 of 3 - 19+ 3-dose series) Never done   HPV VACCINES (1 - 3-dose SCDM series) Never done   INFLUENZA VACCINE  02/28/2024    Past Medical History:  Diagnosis Date   Arthritis    Basal cell carcinoma    History of chicken pox    Subclinical hypothyroidism 06/06/2018    Past Surgical History:  Procedure Laterality Date   deep tissue biopsy Right 01/27/2009   ankle.  arthritis   MOHS SURGERY  2022   basal cell carcinoma removal from forehead   OTHER SURGICAL HISTORY Right 07/30/2008   ankle-- deep tissue biopsy    Family History  Problem Relation Age of Onset   Arthritis Mother        osteoarthritis   Osteopenia Mother    Skin cancer Father    OCD Sister    Urticaria Maternal Aunt     Cancer Maternal Aunt        breast   Breast cancer Maternal Aunt    Schizophrenia Maternal Aunt    Arthritis Maternal Grandmother    Stroke Maternal Grandfather    Cancer Maternal Grandfather 10       colon   Allergic rhinitis Neg Hx    Angioedema Neg Hx    Eczema Neg Hx    Immunodeficiency Neg Hx     Social History   Socioeconomic History   Marital status: Married    Spouse name: Not on file   Number of children: 0   Years of education: Not on file   Highest education level: Doctorate  Occupational History    Employer: [employer]  Tobacco Use   Smoking status: Never    Passive exposure: Never   Smokeless tobacco: Never  Vaping Use   Vaping status: Never Used  Substance and Sexual Activity   Alcohol use: Yes    Alcohol/week: 2.0 - 4.0 standard drinks of alcohol    Types: 2 - 4 Standard drinks or equivalent per week   Drug use: No   Sexual activity: Yes    Partners: Male    Birth control/protection: Pill  Other Topics Concern   Not on file  Social History Narrative   Married   No  children   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 cooking         Social Drivers of [employer]: Low Risk  (03/23/2024)   Overall Financial Resource Strain (CARDIA)    Difficulty of Paying Living Expenses: Not hard at all  Food Insecurity: No Food Insecurity (03/23/2024)   Hunger Vital Sign    Worried About Running Out of Food in the Last Year: Never true    Ran Out of Food in the Last Year: Never true  Transportation Needs: No Transportation Needs (03/23/2024)   PRAPARE - Administrator, Civil Service (Medical): No    Lack of Transportation (Non-Medical): No  Physical Activity: Insufficiently Active (03/23/2024)   Exercise Vital Sign    Days of Exercise per Week: 3 days    Minutes of Exercise per Session: 20 min  Stress: No Stress Concern Present (03/23/2024)   [employer] of Occupational Health - Occupational  Stress Questionnaire    Feeling of Stress: Only a little  Social Connections: Unknown (03/23/2024)   Social Connection and Isolation Panel    Frequency of Communication with Friends and Family: Once a week    Frequency of Social Gatherings with Friends and Family: Not on file    Attends Religious Services: Not on file    Active Member of Clubs or Organizations: Not on file    Attends Banker Meetings: Not on file    Marital Status: Married  Intimate Partner Violence: Unknown (11/03/2021)   Received from [facility]   HITS    Physically Hurt: Not on file    Insult or Talk Down To: Not on file    Threaten Physical Harm: Not on file    Scream or Curse: Not on file    Outpatient Medications Prior to Visit  Medication Sig Dispense Refill   albuterol  (PROVENTIL  HFA) 108 (90 Base) MCG/ACT inhaler Inhale 2 puffs into the lungs every 4 (four) hours as needed for wheezing or shortness of breath. 1 each 1   azelastine  (ASTELIN ) 0.1 % nasal spray Use 1 to 2 sprays each nostril twice a day as needed for runny nose/drainage down the throat 30 mL 3   fluticasone  (FLONASE ) 50 MCG/ACT nasal spray Use 1 to 2 sprays each nostril once a day as needed for stuffy nose 16 g 3   ibuprofen (ADVIL) 600 MG tablet Take 600 mg by mouth as needed.     levonorgestrel -ethinyl estradiol (VIENVA) 0.1-20 MG-MCG tablet Take 1 tablet by mouth daily. 84 tablet 1   LORATADINE  ALLERGY  RELIEF PO Take by mouth.     No facility-administered medications prior to visit.    Allergies  Allergen Reactions   Compazine [Prochlorperazine Edisylate] Other (See Comments)    Affected the muscles in my neck, could not move my head.   Sulfa Antibiotics     Childhood reaction    Review of Systems  Respiratory:  Positive for cough.        Objective:    Physical Exam Constitutional:      General: She is not in acute distress.    Appearance: Normal appearance. She is well-developed.  HENT:     Head:  Normocephalic and atraumatic.     Right Ear: External ear normal.     Left Ear: External ear normal.  Eyes:     General: No scleral icterus. Neck:     Thyroid : No thyromegaly.  Cardiovascular:     Rate  and Rhythm: Normal rate and regular rhythm.     Heart sounds: Normal heart sounds. No murmur heard. Pulmonary:     Effort: Pulmonary effort is normal. No respiratory distress.     Breath sounds: Normal breath sounds. No wheezing.  Musculoskeletal:     Cervical back: Neck supple.  Skin:    General: Skin is warm and dry.  Neurological:     Mental Status: She is alert and oriented to person, place, and time.  Psychiatric:        Mood and Affect: Mood normal.        Behavior: Behavior normal.        Thought Content: Thought content normal.        Judgment: Judgment normal.      BP 109/69   Pulse (!) 56   Temp 98.8 F (37.1 C)   Resp 18   Ht 5' 7 (1.702 m)   Wt 132 lb 9.6 oz (60.1 kg)   LMP 03/03/2024 (Approximate)   SpO2 100%   BMI 20.77 kg/m  Wt Readings from Last 3 Encounters:  03/24/24 132 lb 9.6 oz (60.1 kg)  03/22/23 131 lb (59.4 kg)  05/29/22 127 lb 9.6 oz (57.9 kg)       Assessment & Plan:   Problem List Items Addressed This Visit       Unprioritized   Cough - Primary   Normal lung exam.  Differential includes allergies, asthma, gerd, infectious etiology.  Suspect silent reflux as most likely cause. Will rx with omeprazole . Pt to call if symptoms worsen or if she develops fever. Otherwise will plan to meet back in 1 month.       Relevant Medications   omeprazole  (PRILOSEC) 40 MG capsule    I am having Gina Butler start on omeprazole . I am also having her maintain her fluticasone , azelastine , albuterol , ibuprofen, LORATADINE  ALLERGY  RELIEF PO, and levonorgestrel -ethinyl estradiol.  Meds ordered this encounter  Medications   omeprazole  (PRILOSEC) 40 MG capsule    Sig: Take 1 capsule (40 mg total) by mouth daily.    Dispense:  30 capsule     Refill:  3    Supervising Provider:   DOMENICA BLACKBIRD A [[id number]]

## 2024-03-25 ENCOUNTER — Ambulatory Visit: Admitting: Family

## 2024-04-01 ENCOUNTER — Ambulatory Visit
Admission: RE | Admit: 2024-04-01 | Discharge: 2024-04-01 | Disposition: A | Discharge status: Home / Self Care | Source: Ambulatory Visit

## 2024-04-01 DIAGNOSIS — Z1231 Encounter for screening mammogram for malignant neoplasm of breast: Secondary | ICD-10-CM

## 2024-04-06 ENCOUNTER — Ambulatory Visit: Payer: Self-pay | Admitting: Family

## 2024-05-19 ENCOUNTER — Encounter: Payer: Self-pay | Admitting: Family

## 2024-05-26 ENCOUNTER — Ambulatory Visit: Admitting: Family

## 2024-05-26 VITALS — BP 110/73 | HR 70 | Temp 98.9°F | Resp 16 | Ht 67.0 in | Wt 131.6 lb

## 2024-05-26 DIAGNOSIS — M7582 Other shoulder lesions, left shoulder: Secondary | ICD-10-CM | POA: Diagnosis not present

## 2024-05-26 DIAGNOSIS — R059 Cough, unspecified: Secondary | ICD-10-CM

## 2024-05-26 NOTE — Patient Instructions (Signed)
 VISIT SUMMARY:  You visited us  today due to persistent and worsening left shoulder pain, along with some additional symptoms like headache, neck pain, and a slightly swollen throat. We discussed your shoulder pain in detail and provided a plan to address it, as well as advice regarding your chronic cough.  YOUR PLAN:  LEFT SHOULDER ROTATOR CUFF STRAIN: Your left shoulder pain with movement suggests a rotator cuff strain, which could be due to a partial tear or inflammation. -We will refer you to a sports medicine specialist for further evaluation and possibly an ultrasound. -We recommend starting physical therapy to help with your shoulder pain. -You can take naproxen once daily for two weeks if needed for pain relief.  CHRONIC COUGH, IMPROVED: Your chronic cough has improved with the use of omeprazole . -Take omeprazole  every other day to see if your symptoms remain under control. -If your symptoms stay stable, you may consider discontinuing omeprazole . If they worsen, you can resume taking it.

## 2024-05-26 NOTE — Progress Notes (Signed)
 Subjective:     Patient ID: Gina Butler, female    DOB: 09-01-1980, 43 y.o.   MRN: [id number]  Chief Complaint  Patient presents with   Shoulder Pain    Patient reports left shoulder pain for about 1 month    Shoulder Pain     Discussed the use of AI scribe software for clinical note transcription with the patient, who gave verbal consent to proceed.  History of Present Illness  Gina Butler is a 43 year old female who presents with left shoulder pain. She experiences persistent and worsening left shoulder pain, initially thought to be due to sleeping incorrectly. An incident where she was startled and leaned against a wall resulted in excruciating pain radiating through her arm and shoulder. The pain is aggravated by movement, particularly when raising her arm, and is localized to the shoulder area. At night, the pain is a level 2 and disrupts her sleep, as she cannot sleep comfortably on the affected side. She has tried over-the-counter Advil without relief. She recalls a twinge in her shoulder after lifting her niece, with no specific injury or fall. In addition to shoulder pain, she experiences a headache, neck pain, and a slightly swollen throat, with some dry blood in the back of her nose upon waking, which she attributes to drainage. She has a history of random coughing episodes, which have improved with the use of omeprazole . She continues to take omeprazole  mostly every day, with occasional missed doses, and notes that the urge to cough is still present but less frequent.      Health Maintenance Due  Topic Date Due   Hepatitis B Vaccines 19-59 Average Risk (1 of 3 - 19+ 3-dose series) Never done   HPV VACCINES (1 - 3-dose SCDM series) Never done    Past Medical History:  Diagnosis Date   Arthritis    Basal cell carcinoma    History of chicken pox    Subclinical hypothyroidism 06/06/2018    Past Surgical History:  Procedure Laterality Date   deep tissue biopsy  Right 01/27/2009   ankle.  arthritis   MOHS SURGERY  2022   basal cell carcinoma removal from forehead   OTHER SURGICAL HISTORY Right 07/30/2008   ankle-- deep tissue biopsy    Family History  Problem Relation Age of Onset   Arthritis Mother        osteoarthritis   Osteopenia Mother    Skin cancer Father    OCD Sister    Urticaria Maternal Aunt    Cancer Maternal Aunt        breast   Breast cancer Maternal Aunt    Schizophrenia Maternal Aunt    Arthritis Maternal Grandmother    Stroke Maternal Grandfather    Cancer Maternal Grandfather 108       colon   Allergic rhinitis Neg Hx    Angioedema Neg Hx    Eczema Neg Hx    Immunodeficiency Neg Hx     Social History   Socioeconomic History   Marital status: Married    Spouse name: Not on file   Number of children: 0   Years of education: Not on file   Highest education level: Doctorate  Occupational History    Employer: [employer]  Tobacco Use   Smoking status: Never    Passive exposure: Never   Smokeless tobacco: Never  Vaping Use   Vaping status: Never Used  Substance and Sexual Activity   Alcohol  use: Yes    Alcohol/week: 2.0 - 4.0 standard drinks of alcohol    Types: 2 - 4 Standard drinks or equivalent per week   Drug use: No   Sexual activity: Yes    Partners: Male    Birth control/protection: Pill  Other Topics Concern   Not on file  Social History Narrative   Married   No children   Theatre stage manager at [employer]   Enjoys cooking         Social Drivers of Corporate Investment Banker Strain: Low Risk  (03/23/2024)   Overall Financial Resource Strain (CARDIA)    Difficulty of Paying Living Expenses: Not hard at all  Food Insecurity: No Food Insecurity (03/23/2024)   Hunger Vital Sign    Worried About Running Out of Food in the Last Year: Never true    Ran Out of Food in the Last Year: Never true  Transportation Needs: No Transportation Needs (03/23/2024)   PRAPARE -  Administrator, Civil Service (Medical): No    Lack of Transportation (Non-Medical): No  Physical Activity: Insufficiently Active (03/23/2024)   Exercise Vital Sign    Days of Exercise per Week: 3 days    Minutes of Exercise per Session: 20 min  Stress: No Stress Concern Present (03/23/2024)   [employer] of Occupational Health - Occupational Stress Questionnaire    Feeling of Stress: Only a little  Social Connections: Unknown (03/23/2024)   Social Connection and Isolation Panel    Frequency of Communication with Friends and Family: Once a week    Frequency of Social Gatherings with Friends and Family: Not on file    Attends Religious Services: Not on file    Active Member of Clubs or Organizations: Not on file    Attends Banker Meetings: Not on file    Marital Status: Married  Intimate Partner Violence: Unknown (11/03/2021)   Received from [facility]   HITS    Physically Hurt: Not on file    Insult or Talk Down To: Not on file    Threaten Physical Harm: Not on file    Scream or Curse: Not on file    Outpatient Medications Prior to Visit  Medication Sig Dispense Refill   albuterol  (PROVENTIL  HFA) 108 (90 Base) MCG/ACT inhaler Inhale 2 puffs into the lungs every 4 (four) hours as needed for wheezing or shortness of breath. 1 each 1   levonorgestrel -ethinyl estradiol (VIENVA) 0.1-20 MG-MCG tablet Take 1 tablet by mouth daily. 84 tablet 1   LORATADINE  ALLERGY  RELIEF PO Take by mouth.     omeprazole  (PRILOSEC) 40 MG capsule Take 1 capsule (40 mg total) by mouth daily. 30 capsule 3   azelastine  (ASTELIN ) 0.1 % nasal spray Use 1 to 2 sprays each nostril twice a day as needed for runny nose/drainage down the throat 30 mL 3   fluticasone  (FLONASE ) 50 MCG/ACT nasal spray Use 1 to 2 sprays each nostril once a day as needed for stuffy nose 16 g 3   ibuprofen (ADVIL) 600 MG tablet Take 600 mg by mouth as needed.     No facility-administered medications prior to  visit.    Allergies  Allergen Reactions   Compazine [Prochlorperazine Edisylate] Other (See Comments)    Affected the muscles in my neck, could not move my head.   Sulfa Antibiotics     Childhood reaction    ROS     Objective:    Physical Exam Constitutional:  General: She is not in acute distress.    Appearance: Normal appearance. She is well-developed.  HENT:     Head: Normocephalic and atraumatic.     Right Ear: External ear normal.     Left Ear: External ear normal.     Mouth/Throat:     Pharynx: Oropharynx is clear. No posterior oropharyngeal erythema.     Tonsils: No tonsillar exudate.     Comments: + pain with abduction left arm, + empty can test No swelling or tenderness to palpation Eyes:     General: No scleral icterus. Neck:     Thyroid : No thyromegaly.  Cardiovascular:     Rate and Rhythm: Normal rate and regular rhythm.     Heart sounds: Normal heart sounds. No murmur heard. Pulmonary:     Effort: Pulmonary effort is normal. No respiratory distress.     Breath sounds: Normal breath sounds. No wheezing.  Musculoskeletal:     Cervical back: Neck supple.  Skin:    General: Skin is warm and dry.  Neurological:     Mental Status: She is alert and oriented to person, place, and time.  Psychiatric:        Mood and Affect: Mood normal.        Behavior: Behavior normal.        Thought Content: Thought content normal.        Judgment: Judgment normal.      BP 110/73 (BP Location: Right Arm, Patient Position: Sitting, Cuff Size: Normal)   Pulse 70   Temp 98.9 F (37.2 C) (Oral)   Resp 16   Ht 5' 7 (1.702 m)   Wt 131 lb 9.6 oz (59.7 kg)   SpO2 98%   BMI 20.61 kg/m  Wt Readings from Last 3 Encounters:  05/26/24 131 lb 9.6 oz (59.7 kg)  03/24/24 132 lb 9.6 oz (60.1 kg)  03/22/23 131 lb (59.4 kg)       Assessment & Plan:   Problem List Items Addressed This Visit       Unprioritized   Cough   Has improved with omeprazole .  Recommend  trial of every other day and if cough is still stable can trial off.        Other Visit Diagnoses       Rotator cuff tendinitis, left    -  Primary   Relevant Orders   Ambulatory referral to Sports Medicine     New. Declines NSAID.  Recommend referral to Sports Medicine.   I have discontinued Gina Butler's fluticasone , azelastine , and ibuprofen. I am also having her maintain her albuterol , LORATADINE  ALLERGY  RELIEF PO, levonorgestrel -ethinyl estradiol, and omeprazole .  No orders of the defined types were placed in this encounter.

## 2024-05-26 NOTE — Assessment & Plan Note (Signed)
 Has improved with omeprazole .  Recommend trial of every other day and if cough is still stable can trial off.

## 2024-06-02 ENCOUNTER — Ambulatory Visit

## 2024-06-02 VITALS — BP 102/70 | Ht 67.0 in | Wt 131.0 lb

## 2024-06-02 DIAGNOSIS — M25512 Pain in left shoulder: Secondary | ICD-10-CM | POA: Diagnosis not present

## 2024-06-02 DIAGNOSIS — M7542 Impingement syndrome of left shoulder: Secondary | ICD-10-CM | POA: Diagnosis not present

## 2024-06-02 DIAGNOSIS — M7522 Bicipital tendinitis, left shoulder: Secondary | ICD-10-CM

## 2024-06-02 DIAGNOSIS — M7582 Other shoulder lesions, left shoulder: Secondary | ICD-10-CM

## 2024-06-02 MED ORDER — NAPROXEN 500 MG PO TABS: 500.0000 mg | ORAL_TABLET | Freq: Two times a day (BID) | ORAL | 1 refills | Status: AC

## 2024-06-02 NOTE — Addendum Note (Signed)
 Addended by: MARCINE HARLENE SAILOR on: 06/02/2024 10:48 AM   Modules accepted: Orders

## 2024-06-02 NOTE — Progress Notes (Signed)
   Subjective:    Patient ID: Gina Butler, female    DOB: 43 y.o., 07/21/1981   MRN: [id number]  Chief Complaint: Left shoulder pain  Discussed the use of AI scribe software for clinical note transcription with the patient, who gave verbal consent to proceed.  43 year old female with past medical history significant for hypothyroidism, inflammatory polyarthritis presenting with left shoulder pain.  History of Present Illness Gina Butler is a 43 year old female who presents with shoulder pain. She was referred by her primary care physician for evaluation of likely rotator cuff tendinitis.  Shoulder pain - Duration of 1.5 to 2 months - Pain worsened by raising arm above head - Onset after lifting knees and picking up niece - Disrupts sleep, especially when shifting positions - Has adjusted sleeping position from usual left side due to discomfort - Avoids movements that exacerbate pain - No history of shoulder dislocation or surgery - No numbness or tingling down the arm  Neck symptoms - Sore neck with sensation of swelling - Limited movement on one side of the neck - No pain radiating into the arm or shoulder from the neck       Objective:   Vitals:   06/02/24 0937  BP: 102/70    Left Shoulder ( compared to normal ) Inspection: - swelling, - scapular dyskinesis Palpation: TTP - greater tuberosity, - AC joint, + biceps tendon, - posterior shoulder AROM/PROM: 180 forward flexion, 180 abduction, 60 external rotation, internal rotation to lumbar spine Strength: 5/5 lift off, 5/5 empty can, 5/5 external rotation, 5/5 flexion, - drop arm test Special tests:    -Rotator Cuff: + Neer's, + Hawkin's, - empty can, + painful arc, + Hornblower   -Labrum: Equivocal O'brien's, - Jerk   -Biceps: ++ speed's, + yergason's    -AC Joint: - cross arm testing     -Instability: - external rotation/apprehension/relocation test, - sulcus sign      Assessment & Plan:   Assessment &  Plan Left biceps and rotator cuff tendinitis with associated left shoulder pain   Chronic left shoulder pain for 1.5 to 2 months is likely due to biceps tendinitis, impingement, and rotator cuff irritation. Pain is localized to the left shoulder, worsens with movement, especially overhead activities, and disrupts sleep. There is no history of shoulder dislocation or surgery. Physical examination indicates biceps tendon involvement and mild rotator cuff irritation. No significant concern for severe pathology requiring MRI or surgical intervention at this time. X-rays are likely unnecessary unless symptoms persist or worsen. Refer to physical therapy for targeted exercises. Prescribe a short course of oral anti-inflammatory medication to aid physical therapy participation.  Follow-up in 6 weeks.  Consider ultrasound at that time if symptoms persisting.

## 2024-07-11 ENCOUNTER — Other Ambulatory Visit: Payer: Self-pay | Admitting: Family

## 2024-07-14 ENCOUNTER — Ambulatory Visit

## 2024-07-14 ENCOUNTER — Other Ambulatory Visit: Payer: Self-pay

## 2024-07-14 VITALS — BP 100/60 | Ht 66.0 in | Wt 130.0 lb

## 2024-07-14 DIAGNOSIS — M7522 Bicipital tendinitis, left shoulder: Secondary | ICD-10-CM

## 2024-07-14 DIAGNOSIS — M7530 Calcific tendinitis of unspecified shoulder: Secondary | ICD-10-CM

## 2024-07-14 DIAGNOSIS — M7582 Other shoulder lesions, left shoulder: Secondary | ICD-10-CM

## 2024-07-14 DIAGNOSIS — M7542 Impingement syndrome of left shoulder: Secondary | ICD-10-CM

## 2024-07-14 DIAGNOSIS — S43432A Superior glenoid labrum lesion of left shoulder, initial encounter: Secondary | ICD-10-CM

## 2024-07-14 NOTE — Progress Notes (Signed)
° °  Subjective:    Patient ID: Gina Butler, female    DOB: 43 y.o., Oct 19, 1980   MRN: [id number]  Chief Complaint: Left shoulder pain 6-week follow-up   History of Present Illness Gina Butler is a 43 year old female with a history of inflammatory arthritis who presents for a six-week follow-up after starting physical therapy for shoulder pain.  Shoulder pain and dysfunction - Persistent shoulder pain for six weeks despite weekly physical therapy, with only 10-20% improvement - Pain localized to the shoulder, with difficulty raising her arm and reaching behind her back - Overhead and behind-the-back motions, such as brushing hair and fastening a bra strap, are painful - New clicking sensation in the shoulder, especially with backward rotation - Physical therapy session revealed a ridgy area in the shoulder muscle; treatment pressure caused mild bruising - Not using naproxen , as pain is tolerable without medication  Inflammatory arthritis - History of inflammatory arthritis - Previous steroid injections for arthritis administered by rheumatology  Musculoskeletal surgical history - Prior ankle surgery     Objective:   Vitals:   07/14/24 0821  BP: 100/60    Left shoulder: + Jerk. + Mayo shear.  Negative O'Brien's.   Limited ultrasound evaluation of the left shoulder: There is hypoechogenic fluid signal tracking between the normal fiber architecture of the biceps tendon when visualized in short axis.  There is also increased Doppler uptake in this area. Small hypoechogenic fluid focus present in a well demarcated pocket superficial to but within the tendon sheath of the biceps tendon approximately one third down the humeral shaft. The subscapularis tendon is well-visualized and appears normal The infraspinatus tendon is well-visualized and appears normal The teres minor tendon is well-visualized and appears normal The supraspinatus tendon is well-visualized and has small  punctate calcifications present near the footprint. No significant enlargement of the subacromial/subdeltoid bursa. Limited evaluation of the posterior glenohumeral joint does reveal what appears to be calcifications present in the glenoid labrum with some potential evidence of disruption of the normal labral architecture. Interpretation: Biceps tendinitis with longitudinal split tearing Supraspinatus calcific tendinosis Posterosuperior labral tear with calcifications.     Assessment & Plan:   Assessment & Plan Left biceps tendinitis with longitudinal split tear   Ultrasound shows increased blood flow and a longitudinal split tear in the left biceps tendon, with fluid tracking between fibers. She experiences persistent pain despite physical therapy. Continue physical therapy.  Discussed options for treatment including salutary neglect, corticosteroid injection of biceps tendon, corticosteroid injection of subacromial space, corticosteroid injection of the glenohumeral joint, prolotherapy of the supraspinatus, shockwave therapy of supraspinatus.  Patient will notify me of what treatment route she wishes to pursue next  Left rotator cuff impingement syndrome with calcific tendinitis   Ultrasound reveals calcific deposits in the supraspinatus muscle without significant tears or bursitis. Impingement is likely due to anatomical structure and biomechanics. Continue physical therapy to address biomechanics and posture. Consider MRI to fully characterize the labrum if symptoms persist.  Suspected left shoulder posterior labral tear   Ultrasound suggests a possible posterior labral tear, contributing to pain and clicking, though there are no significant restraints on daily life. Consider MRI to fully characterize the labrum if symptoms persist. Continue physical therapy to manage symptoms.

## 2024-08-10 ENCOUNTER — Ambulatory Visit
